# Patient Record
Sex: Female | Born: 1980
Health system: Southern US, Community
[De-identification: ages and names within clinical notes are randomized; demographics above are authoritative.]

## PROBLEM LIST (undated history)

## (undated) DIAGNOSIS — G43909 Migraine, unspecified, not intractable, without status migrainosus: Secondary | ICD-10-CM

## (undated) DIAGNOSIS — J45909 Unspecified asthma, uncomplicated: Secondary | ICD-10-CM

## (undated) HISTORY — DX: Unspecified asthma, uncomplicated: J45.909

## (undated) HISTORY — DX: Migraine, unspecified, not intractable, without status migrainosus: G43.909

---

## 2007-12-16 ENCOUNTER — Emergency Department (HOSPITAL_COMMUNITY): Admission: EM | Admit: 2007-12-16 | Discharge: 2007-12-16 | Payer: Self-pay | Admitting: Emergency Medicine

## 2011-02-10 LAB — URINALYSIS, ROUTINE W REFLEX MICROSCOPIC
Hgb urine dipstick: NEGATIVE
Ketones, ur: 40 — AB
Protein, ur: NEGATIVE
Specific Gravity, Urine: 1.029
Urobilinogen, UA: 1

## 2011-02-10 LAB — DIFFERENTIAL
Basophils Absolute: 0.1
Basophils Relative: 0
Eosinophils Absolute: 0.1
Eosinophils Relative: 0
Monocytes Absolute: 0.7

## 2011-02-10 LAB — POCT PREGNANCY, URINE: Preg Test, Ur: NEGATIVE

## 2011-02-10 LAB — COMPREHENSIVE METABOLIC PANEL
ALT: 14
AST: 24
Albumin: 4.5
Alkaline Phosphatase: 42
CO2: 26
Chloride: 102
GFR calc Af Amer: >60
Potassium: 3.9
Total Bilirubin: 1.5 — ABNORMAL HIGH

## 2011-02-10 LAB — CBC
Platelets: 335
RBC: 4.76
WBC: 15.6 — ABNORMAL HIGH

## 2011-02-10 LAB — URINE MICROSCOPIC-ADD ON

## 2011-06-02 ENCOUNTER — Ambulatory Visit (INDEPENDENT_AMBULATORY_CARE_PROVIDER_SITE_OTHER): Payer: 59

## 2011-06-02 DIAGNOSIS — H00019 Hordeolum externum unspecified eye, unspecified eyelid: Secondary | ICD-10-CM

## 2011-10-09 ENCOUNTER — Other Ambulatory Visit: Payer: Self-pay | Admitting: Physician Assistant

## 2011-12-05 ENCOUNTER — Ambulatory Visit (INDEPENDENT_AMBULATORY_CARE_PROVIDER_SITE_OTHER): Payer: 59 | Admitting: Gynecology

## 2011-12-05 ENCOUNTER — Encounter: Payer: Self-pay | Admitting: Gynecology

## 2011-12-05 VITALS — BP 120/84 | Ht 65.0 in | Wt 150.0 lb

## 2011-12-05 DIAGNOSIS — N949 Unspecified condition associated with female genital organs and menstrual cycle: Secondary | ICD-10-CM

## 2011-12-05 DIAGNOSIS — N938 Other specified abnormal uterine and vaginal bleeding: Secondary | ICD-10-CM | POA: Insufficient documentation

## 2011-12-05 LAB — CBC WITH DIFFERENTIAL/PLATELET
Basophils Absolute: 0 10*3/uL (ref 0.0–0.1)
Basophils Relative: 0 % (ref 0–1)
Hemoglobin: 11.9 g/dL — ABNORMAL LOW (ref 12.0–15.0)
MCHC: 31.2 g/dL (ref 30.0–36.0)
Monocytes Relative: 7 % (ref 3–12)
Neutro Abs: 4.4 10*3/uL (ref 1.7–7.7)
Neutrophils Relative %: 61 % (ref 43–77)
RDW: 14.8 % (ref 11.5–15.5)

## 2011-12-05 MED ORDER — MEGESTROL ACETATE 40 MG PO TABS: 40.0000 mg | ORAL_TABLET | Freq: Two times a day (BID) | ORAL | Status: AC

## 2011-12-05 NOTE — Progress Notes (Signed)
Patient 31 year old (same-sex relationship) new patient to the practice who stated that she has been bleeding for a week and a half which is quite earlier than her anticipated menstrual cycle. And she was recently sexually active with sex toys and felt that she may have had a vaginal tear contributing to her bleeding. She denies any weight changes, any medication, any visual disturbances, nipple discharge, or unusual headaches. Patient with no prior Pap smears.  Exam: Bartholin urethra Skene glands within normal limits Vagina: Blood present in the vaginal vault were no lesions or tears noted Cervix some blood extruding out of the cervical os Uterus: Anteverted normal size shape and consistency Adnexa: No palpable masses or tenderness on the left adnexa questionable fullness on the right adnexa Rectal: Not examined  Assessment/plan: Dysfunction uterine bleeding. Patient no prior intercourse with opposite sex member. Will check a TSH, prolactin, and CBC. She will be placed on Megace 40 mg twice a day for 5 days. She will return back to the office next week for sonohysterogram to complete evaluation. We'll need to rule out the possibility of a right ovarian cyst as well. She will need to return back in a later date perhaps in August for overdue Pap smear.

## 2011-12-05 NOTE — Addendum Note (Signed)
Addended byValeda Malm L on: 12/05/2011 02:13 PM   Modules accepted: Orders

## 2011-12-06 LAB — TSH: TSH: 2.92 u[IU]/mL (ref 0.350–4.500)

## 2011-12-07 LAB — PROLACTIN: Prolactin: 17.1 ng/mL

## 2011-12-21 ENCOUNTER — Telehealth: Payer: Self-pay | Admitting: *Deleted

## 2011-12-21 NOTE — Telephone Encounter (Signed)
Pt has [facility] appointment tomorrow, pt is now bleeding mild flow. Bleeding started on Tuesday, pt isnt sure if this is her cycle or just irregular bleeding. She would like to know if you still want her to come in for [facility]? Please advise

## 2011-12-21 NOTE — Telephone Encounter (Signed)
Pt informed with the below note. 

## 2011-12-21 NOTE — Telephone Encounter (Signed)
Tell patient to keep appointment for the sonohysterogram

## 2011-12-22 ENCOUNTER — Ambulatory Visit (INDEPENDENT_AMBULATORY_CARE_PROVIDER_SITE_OTHER): Payer: 59 | Admitting: Gynecology

## 2011-12-22 ENCOUNTER — Other Ambulatory Visit: Payer: Self-pay | Admitting: Gynecology

## 2011-12-22 ENCOUNTER — Ambulatory Visit (INDEPENDENT_AMBULATORY_CARE_PROVIDER_SITE_OTHER): Payer: 59

## 2011-12-22 DIAGNOSIS — D259 Leiomyoma of uterus, unspecified: Secondary | ICD-10-CM | POA: Insufficient documentation

## 2011-12-22 DIAGNOSIS — N938 Other specified abnormal uterine and vaginal bleeding: Secondary | ICD-10-CM

## 2011-12-22 DIAGNOSIS — N949 Unspecified condition associated with female genital organs and menstrual cycle: Secondary | ICD-10-CM

## 2011-12-22 DIAGNOSIS — N946 Dysmenorrhea, unspecified: Secondary | ICD-10-CM

## 2011-12-22 DIAGNOSIS — D649 Anemia, unspecified: Secondary | ICD-10-CM | POA: Insufficient documentation

## 2011-12-22 NOTE — Progress Notes (Signed)
Patient 31 year old (same-sex relationship) who was seen as a new patient to the practice on 12/05/2011 who stated that she had been bleeding for a week and a half which is quite earlier than her anticipated menstrual cycle. Patient is in a same-sex relationship was started on Megace 40 mg twice a day for 5 days to stop her bleeding and was instructed to return to the office today for sonohysterogram. She had a TSH and prolactin which were normal. Her CBC had a normal platelet count her hemoglobin was 11.9. She had been instructed to start taking one iron tablet daily.   Ultrasound 4/sonohysterogram: Uterus measures 7.6 x 5.0 x 3.4 cm with an endometrial stripe of 3 mm. 3 small fibroids were noted and the largest one measuring 10 x 6 mm. Both ovaries appeared to be normal. Sonohysterogram no intracavitary defect.  Assessment/plan: Isolated event of dysfunction uterine bleeding. Normal TSH and prolactin and sonohysterogram. We'll monitor her cycles over the next 3 months and she will maintain menstrual calendar. If she continues to have irregular bleeding we discussed different options  to include oral contraceptive pill versus Mirena IUD. Literature information was provided and we'll continue to monitor closely. All questions are answered and we'll follow accordingly.

## 2012-06-07 ENCOUNTER — Emergency Department (HOSPITAL_COMMUNITY): Payer: 59

## 2012-06-07 ENCOUNTER — Emergency Department (HOSPITAL_COMMUNITY)
Admission: EM | Admit: 2012-06-07 | Discharge: 2012-06-07 | Disposition: A | Discharge status: Home / Self Care | Payer: 59 | Attending: Emergency Medicine | Admitting: Emergency Medicine

## 2012-06-07 ENCOUNTER — Encounter (HOSPITAL_COMMUNITY): Payer: Self-pay | Admitting: Emergency Medicine

## 2012-06-07 ENCOUNTER — Encounter (HOSPITAL_COMMUNITY): Payer: Self-pay

## 2012-06-07 ENCOUNTER — Emergency Department (INDEPENDENT_AMBULATORY_CARE_PROVIDER_SITE_OTHER)
Admission: EM | Admit: 2012-06-07 | Discharge: 2012-06-07 | Disposition: A | Discharge status: Skilled Nursing Facility | Payer: 59 | Source: Home / Self Care

## 2012-06-07 DIAGNOSIS — R071 Chest pain on breathing: Secondary | ICD-10-CM | POA: Insufficient documentation

## 2012-06-07 DIAGNOSIS — R0789 Other chest pain: Secondary | ICD-10-CM

## 2012-06-07 DIAGNOSIS — R0989 Other specified symptoms and signs involving the circulatory and respiratory systems: Secondary | ICD-10-CM

## 2012-06-07 DIAGNOSIS — R079 Chest pain, unspecified: Secondary | ICD-10-CM

## 2012-06-07 DIAGNOSIS — R110 Nausea: Secondary | ICD-10-CM | POA: Insufficient documentation

## 2012-06-07 DIAGNOSIS — R0602 Shortness of breath: Secondary | ICD-10-CM | POA: Insufficient documentation

## 2012-06-07 DIAGNOSIS — R0600 Dyspnea, unspecified: Secondary | ICD-10-CM

## 2012-06-07 LAB — CBC WITH DIFFERENTIAL/PLATELET
Lymphocytes Relative: 26 % (ref 12–46)
Lymphs Abs: 2 10*3/uL (ref 0.7–4.0)
Neutro Abs: 4.9 10*3/uL (ref 1.7–7.7)
Neutrophils Relative %: 64 % (ref 43–77)
Platelets: 288 10*3/uL (ref 150–400)
RBC: 4.46 MIL/uL (ref 3.87–5.11)
WBC: 7.7 10*3/uL (ref 4.0–10.5)

## 2012-06-07 LAB — COMPREHENSIVE METABOLIC PANEL
ALT: 8 U/L (ref 0–35)
Alkaline Phosphatase: 43 U/L (ref 39–117)
CO2: 24 mEq/L (ref 19–32)
GFR calc Af Amer: >90 mL/min (ref >90)
GFR calc non Af Amer: 86 mL/min — ABNORMAL LOW (ref >90)
Glucose, Bld: 75 mg/dL (ref 70–99)
Potassium: 4 mEq/L (ref 3.5–5.1)
Sodium: 139 mEq/L (ref 135–145)

## 2012-06-07 MED ORDER — IOHEXOL 350 MG/ML SOLN
100.0000 mL | Freq: Once | INTRAVENOUS | Status: AC | PRN
Start: 2012-06-07 — End: 2012-06-07
  Administered 2012-06-07: 80 mL via INTRAVENOUS

## 2012-06-07 MED ORDER — HYDROCODONE-ACETAMINOPHEN 5-325 MG PO TABS: 1.0000 | ORAL_TABLET | Freq: Four times a day (QID) | ORAL | Status: DC | PRN

## 2012-06-07 MED ORDER — KETOROLAC TROMETHAMINE 30 MG/ML IJ SOLN
30.0000 mg | Freq: Once | INTRAMUSCULAR | Status: AC
  Administered 2012-06-07: 30 mg via INTRAVENOUS
  Filled 2012-06-07: qty 1

## 2012-06-07 MED ORDER — IBUPROFEN 800 MG PO TABS: 800.0000 mg | ORAL_TABLET | Freq: Three times a day (TID) | ORAL | Status: DC | PRN

## 2012-06-07 NOTE — ED Notes (Addendum)
C/o pain in left mid chest since last PM; pain waxes and wanes, but never completely relieved. Reports she had dizziness, nausea, but no sweats, vomiting; chest mildly tender to palpation, chest clear to ascultation; NAD at present; denies injury; has not taken any medication for her symptoms

## 2012-06-07 NOTE — ED Provider Notes (Signed)
History     CSN: [id number]  Arrival date & time 06/07/12  1412   First MD Initiated Contact with Patient 06/07/12 1426      No chief complaint on file.   (Consider location/radiation/quality/duration/timing/severity/associated sxs/prior treatment) HPI The patient presents to the emergency department with constant, left-sided chest pain for the last 24 hours.  The patient states that she does not have shortness of breath, nausea, vomiting, diarrhea, headache, back pain, neck pain, fever, cough, or syncope.  Patient, states that movement makes her pain, worse and some deep breathing, as well.  The patient states she did not take anything for her symptoms.  The patient denies any alleviating factors.     History reviewed. No pertinent past medical history.  History reviewed. No pertinent past surgical history.  Family History  Problem Relation Age of Onset  . Heart disease Sister     History  Substance Use Topics  . Smoking status: Never Smoker   . Smokeless tobacco: Never Used  . Alcohol Use: Yes     Comment: SOCIAL    OB History    Grav Para Term Preterm Abortions TAB SAB Ect Mult Living                  Review of Systems All other systems negative except as documented in the HPI. All pertinent positives and negatives as reviewed in the HPI. Allergies  Review of patient's allergies indicates no known allergies.  Home Medications  No current outpatient prescriptions on file.  BP 115/74  Pulse 89  Temp 99 F (37.2 C)  Resp 16  SpO2 100%  LMP 05/15/2012  Physical Exam  Nursing note and vitals reviewed. Constitutional: She is oriented to person, place, and time. She appears well-developed and well-nourished. No distress.  HENT:  Head: Normocephalic and atraumatic.  Eyes: EOM are normal. Pupils are equal, round, and reactive to light.  Cardiovascular: Normal rate, regular rhythm and normal heart sounds.  Exam reveals no gallop and no friction rub.   No murmur  heard. Pulmonary/Chest: Effort normal and breath sounds normal. She exhibits tenderness.  Neurological: She is alert and oriented to person, place, and time.  Skin: Skin is warm and dry. No rash noted.    ED Course  Procedures (including critical care time)  Labs Reviewed  COMPREHENSIVE METABOLIC PANEL - Abnormal; Notable for the following:    GFR calc non Af Amer 86 (*)     All other components within normal limits  D-DIMER, QUANTITATIVE - Abnormal; Notable for the following:    D-Dimer, Quant 1.03 (*)     All other components within normal limits  CBC WITH DIFFERENTIAL  POCT I-STAT TROPONIN I   Dg Chest 2 View  06/07/2012  *RADIOLOGY REPORT*  Clinical Data: Chest pain  CHEST - 2 VIEW  Comparison: None.  Findings: Cardiomediastinal silhouette is unremarkable.  No acute infiltrate or pleural effusion.  No pulmonary edema.  Bony thorax is unremarkable.  IMPRESSION:  No active disease.   Original Report Authenticated By: Natasha Mead, M.D.    Ct Angio Chest Pe W/cm &/or Wo Cm  06/07/2012  *RADIOLOGY REPORT*  Clinical Data: Pain, shortness of breath  CT ANGIOGRAPHY CHEST  Technique:  Multidetector CT imaging of the chest using the standard protocol during bolus administration of intravenous contrast. Multiplanar reconstructed images including MIPs were obtained and reviewed to evaluate the vascular anatomy.  Contrast: 80mL OMNIPAQUE IOHEXOL 350 MG/ML SOLN  Comparison: None.  Findings: Sagittal images of  the spine and sternum are unremarkable.  Images of the thoracic inlet are unremarkable.  Central airways are patent.  The study is of excellent technical quality.  No pulmonary embolus is noted.  There is no mediastinal hematoma or adenopathy.  The heart size is within normal limits.  No pericardial effusion. Visualized upper abdomen is unremarkable.  Images of the lung parenchyma shows no acute infiltrate or pleural effusion.  No pulmonary edema.  No pulmonary nodules are noted.  Nonspecific  bilateral axillary lymph nodes are noted.  IMPRESSION:  1.  No pulmonary embolus. 2.  No acute infiltrate or pulmonary edema. 3.  No adenopathy.   Original Report Authenticated By: Natasha Mead, M.D.    The patient is PERC negative.  The nursing staff ordered a d-dimer, which was high, and we confirmed there was no PE by CTA of his chest.  The patient is a young, healthy female, with no risk factors for coronary artery disease.  The patient will be treated for chest wall pain, based on her history of present illness, and physical exam findings, along with her testing here in the emergency department.   MDM          Carlyle Dolly, PA-C 06/07/12 1900

## 2012-06-07 NOTE — ED Notes (Signed)
Meal given to patient 

## 2012-06-07 NOTE — ED Notes (Signed)
Chest pain since last night felt nauseated  Whole left side some sob

## 2012-06-07 NOTE — ED Provider Notes (Signed)
Medical screening examination/treatment/procedure(s) were performed by resident physician or non-physician practitioner and as supervising physician I was immediately available for consultation/collaboration.   KINDL,JAMES DOUGLAS MD.    James D Kindl, MD 06/07/12 1417 

## 2012-06-07 NOTE — ED Notes (Signed)
Patient is resting comfortably. 

## 2012-06-07 NOTE — ED Provider Notes (Signed)
History     CSN: [id number]  Arrival date & time 06/07/12  1152   None     Chief Complaint  Patient presents with  . Chest Pain    (Consider location/radiation/quality/duration/timing/severity/associated sxs/prior treatment) HPI Comments: This is a 32 year old female considered herself in generally good health but is complaining of left anterior chest pain since last night. She describes the pain as a tightness and pressure. It is located beneath the left breast and substernal and often radiates to the  Left lateral and posterior chest.  It is constant but waxes and wanes. It is worse with exertion. It is associated with shortness of breath and is also worse with exertion. She had an episode of nausea and dizziness and this was self-limiting. It is not exacerbated with musculoskeletal movement or taking a deep breath. She denies any known chest wall tenderness. There is no personal medical history of chest or lung problems and her family medical history is negative for known cardiopulmonary disease.   History reviewed. No pertinent past medical history.  History reviewed. No pertinent past surgical history.  Family History  Problem Relation Age of Onset  . Heart disease Sister     History  Substance Use Topics  . Smoking status: Never Smoker   . Smokeless tobacco: Never Used  . Alcohol Use: Yes     Comment: SOCIAL    OB History    Grav Para Term Preterm Abortions TAB SAB Ect Mult Living                  Review of Systems  Constitutional: Positive for activity change. Negative for fever and diaphoresis.  HENT: Negative.   Respiratory: Positive for chest tightness and shortness of breath. Negative for cough.   Cardiovascular: Positive for chest pain. Negative for leg swelling.  Gastrointestinal: Positive for nausea. Negative for vomiting.  Genitourinary: Negative.   Musculoskeletal: Positive for back pain.  Skin: Negative.   Neurological: Positive for dizziness.    Hematological: Negative.   Psychiatric/Behavioral: The patient is nervous/anxious.     Allergies  Review of patient's allergies indicates no known allergies.  Home Medications  No current outpatient prescriptions on file.  BP 117/84  Pulse 86  Temp 98.8 F (37.1 C) (Oral)  Resp 10  SpO2 100%  Physical Exam  Nursing note and vitals reviewed. Constitutional: She is oriented to person, place, and time. She appears well-developed and well-nourished. No distress.  HENT:  Head: Normocephalic and atraumatic.  Mouth/Throat: No oropharyngeal exudate.  Eyes: EOM are normal. Pupils are equal, round, and reactive to light.  Neck: Normal range of motion. Neck supple. No JVD present.  Cardiovascular: Normal rate, normal heart sounds and intact distal pulses.   No murmur heard. Pulmonary/Chest: Effort normal and breath sounds normal. No respiratory distress. She has no wheezes. She has no rales. She exhibits no tenderness.       Unable to reproduce chest wall or costal margin tenderness with the exception of the upper most L para sternal area.  Abdominal: Soft. She exhibits no mass. There is no rebound and no guarding.       Minor tenderness in the epigastrium  Musculoskeletal: Normal range of motion. She exhibits no edema.  Neurological: She is alert and oriented to person, place, and time. No cranial nerve deficit.  Skin: Skin is warm and dry.  Psychiatric: She has a normal mood and affect.    ED Course  Procedures (including critical care time)  Labs Reviewed -  No data to display No results found.   1. Chest pain at rest   2. Dyspnea       MDM  32 year old female with atypical type chest pain. She describes the anterior L chest pain as tightness and pressure. It is constant but does worsen with exertion. It is associated with intermittent nausea and shortness of breath which is also worse with exertion. EKG is normal sinus rhythm with no ectopy or ST-T. wave changes. Although  she is at low risk a PE and angina consideration should be evaluated. She is stable and in no distress. She will be transferred by shuttle to the emergency department.           Hayden Rasmussen, NP 06/07/12 1408

## 2012-06-07 NOTE — ED Notes (Signed)
Patient states she has increased shortness of breath and chest pain with activity.

## 2012-06-07 NOTE — ED Notes (Signed)
comfortable

## 2012-06-07 NOTE — ED Provider Notes (Signed)
Medical screening examination/treatment/procedure(s) were performed by non-physician practitioner and as supervising physician I was immediately available for consultation/collaboration.   Lyanne Co, MD 06/07/12 1901

## 2013-01-01 ENCOUNTER — Emergency Department (HOSPITAL_COMMUNITY)
Admission: EM | Admit: 2013-01-01 | Discharge: 2013-01-01 | Disposition: A | Discharge status: Home / Self Care | Payer: 59 | Attending: Emergency Medicine | Admitting: Emergency Medicine

## 2013-01-01 ENCOUNTER — Encounter (HOSPITAL_COMMUNITY): Payer: Self-pay | Admitting: *Deleted

## 2013-01-01 ENCOUNTER — Emergency Department (HOSPITAL_COMMUNITY): Payer: 59

## 2013-01-01 DIAGNOSIS — S298XXA Other specified injuries of thorax, initial encounter: Secondary | ICD-10-CM | POA: Insufficient documentation

## 2013-01-01 DIAGNOSIS — Y9389 Activity, other specified: Secondary | ICD-10-CM | POA: Insufficient documentation

## 2013-01-01 DIAGNOSIS — S43402A Unspecified sprain of left shoulder joint, initial encounter: Secondary | ICD-10-CM

## 2013-01-01 DIAGNOSIS — IMO0002 Reserved for concepts with insufficient information to code with codable children: Secondary | ICD-10-CM | POA: Insufficient documentation

## 2013-01-01 DIAGNOSIS — Y92410 Unspecified street and highway as the place of occurrence of the external cause: Secondary | ICD-10-CM | POA: Insufficient documentation

## 2013-01-01 DIAGNOSIS — R0789 Other chest pain: Secondary | ICD-10-CM

## 2013-01-01 MED ORDER — DIAZEPAM 5 MG PO TABS: 5.0000 mg | ORAL_TABLET | Freq: Two times a day (BID) | ORAL | Status: DC
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
## 2013-01-01 NOTE — ED Provider Notes (Signed)
Medical screening examination/treatment/procedure(s) were performed by non-physician practitioner and as supervising physician I was immediately available for consultation/collaboration.  Matie Dimaano N Quintana Canelo, DO 01/01/13 2232 
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
## 2017-02-24 ENCOUNTER — Encounter: Payer: Self-pay | Admitting: Physician Assistant

## 2017-02-24 ENCOUNTER — Ambulatory Visit (INDEPENDENT_AMBULATORY_CARE_PROVIDER_SITE_OTHER): Payer: 59

## 2017-02-24 ENCOUNTER — Ambulatory Visit (INDEPENDENT_AMBULATORY_CARE_PROVIDER_SITE_OTHER): Payer: 59 | Admitting: Physician Assistant

## 2017-02-24 VITALS — BP 110/90 | HR 88 | Temp 98.3°F | Resp 16 | Ht 67.0 in | Wt 174.8 lb

## 2017-02-24 DIAGNOSIS — R059 Cough, unspecified: Secondary | ICD-10-CM

## 2017-02-24 DIAGNOSIS — R05 Cough: Secondary | ICD-10-CM | POA: Diagnosis not present

## 2017-02-24 DIAGNOSIS — J209 Acute bronchitis, unspecified: Secondary | ICD-10-CM | POA: Diagnosis not present

## 2017-02-24 MED ORDER — AZITHROMYCIN 250 MG PO TABS: ORAL_TABLET | ORAL | 0 refills | Status: DC

## 2017-02-24 MED ORDER — BENZONATATE 100 MG PO CAPS: 100.0000 mg | ORAL_CAPSULE | Freq: Three times a day (TID) | ORAL | 0 refills | Status: DC | PRN

## 2017-02-24 NOTE — Progress Notes (Signed)
Jessica Maldonado  MRN: [id number] DOB: [birth year]  PCP: Default, Provider, MD  Subjective:  Pt is a 36 year old female who presents to clinic for cough x 1 month. Cough is non productive. Worsened over the past 2 days. Is now keeping her up at night - improved when she sleeps more upright. She has not taken anything to feel better.  Shortness of breath x 5 months which has been made worse since cough. Shob with walking upstairs. Someone has told her sometimes she wheezes when she sleeps. Denies day time wheezing.   No PMH asthma.  No fam h/o asthma.  Non smoker. No seasonal allergies.  No allergies to medications.  No recent travel.  No medication changes.   Review of Systems  Constitutional: Negative for chills, fatigue and fever.  HENT: Negative for congestion, postnasal drip and rhinorrhea.   Respiratory: Positive for cough and shortness of breath. Negative for chest tightness and wheezing.   Cardiovascular: Negative for chest pain and palpitations.  Psychiatric/Behavioral: Positive for sleep disturbance.    Patient Active Problem List   Diagnosis Date Noted  . Anemia 12/22/2011  . Fibroid uterus 12/22/2011  . DUB (dysfunctional uterine bleeding) 12/05/2011    Current Outpatient Prescriptions on File Prior to Visit  Medication Sig Dispense Refill  . diazepam (VALIUM) 5 MG tablet Take 1 tablet (5 mg total) by mouth 2 (two) times daily. (Patient not taking: Reported on 02/24/2017) 5 tablet 0   No current facility-administered medications on file prior to visit.     No Known Allergies   Objective:  BP 110/90   Pulse 88   Temp 98.3 F (36.8 C) (Oral)   Resp 16   Ht 5\' 7"  (1.702 m)   Wt 174 lb 12.8 oz (79.3 kg)   LMP 02/08/2017   SpO2 100%   BMI 27.38 kg/m   Physical Exam  Constitutional: She is oriented to person, place, and time and well-developed, well-nourished, and in no distress. No distress.  Neck: Normal range of motion. Neck supple.    Cardiovascular: Normal rate, regular rhythm and normal heart sounds.   Pulmonary/Chest: Effort normal and breath sounds normal. No respiratory distress. She has no wheezes.  Neurological: She is alert and oriented to person, place, and time. GCS score is 15.  Skin: Skin is warm and dry.  Psychiatric: Mood, memory, affect and judgment normal.  Vitals reviewed.  Dg Chest 2 View  Result Date: 02/24/2017 CLINICAL DATA:  Cough. EXAM: CHEST  2 VIEW COMPARISON:  Radiographs of January 01, 2013. FINDINGS: The heart size and mediastinal contours are within normal limits. Both lungs are clear. No pneumothorax or pleural effusion is noted. The visualized skeletal structures are unremarkable. IMPRESSION: No active cardiopulmonary disease. Electronically Signed   By: Marijo Conception, M.D.   On: 02/24/2017 12:30    Assessment and Plan :  1. Cough 2. Acute bronchitis, unspecified organism - DG Chest 2 View; Future - benzonatate (TESSALON) 100 MG capsule; Take 1-2 capsules (100-200 mg total) by mouth 3 (three) times daily as needed for cough.  Dispense: 40 capsule; Refill: 0 - azithromycin (ZITHROMAX) 250 MG tablet; Take 2 tabs PO x 1 dose, then 1 tab PO QD x 4 days  Dispense: 6 tablet; Refill: 0 - Advised pt to stay well hydrated. Discussed possibility of lingering cough. F/u in 2-3 weeks for exertional shob evaluation. Suspect possible asthma. Plan for PFTs.   Mercer Pod, PA-C  [facility] 02/24/2017 12:01 PM

## 2017-02-24 NOTE — Patient Instructions (Addendum)
Come back and see me in 2-3 weeks.   Stay well hydrated.   For sore throat try using a honey-based tea. Use 3 teaspoons of honey with juice squeezed from half lemon. Place shaved pieces of ginger into 1/2-1 cup of water and warm over stove top. Then mix the ingredients and repeat every 4 hours as needed.  Cough Syrup Recipe: Sweet Lemon & Honey Thyme  Ingredients a handful of fresh thyme sprigs   1 pint of water (2 cups)  1/2 cup honey (raw is best, but regular will do)  1/2 lemon chopped Instructions 1. Place the lemon in the pint jar and cover with the honey. The honey will macerate the lemons and draw out liquids which taste so delicious! 2. Meanwhile, toss the thyme leaves into a saucepan and cover them with the water. 3. Bring the water to a gentle simmer and reduce it to half, about a cup of tea. 4. When the tea is reduced and cooled a bit, strain the sprigs & leaves, add it into the pint jar and stir it well. 5. Give it a shake and use a spoonful as needed. 6. Store your homemade cough syrup in the refrigerator for about a month.  What causes a cough? In adults, common causes of a cough include: ?An infection of the airways or lungs (such as the common cold) ?Postnasal drip - Postnasal drip is when mucus from the nose drips down or flows along the back of the throat. Postnasal drip can happen when people have: .A cold .Allergies .A sinus infection - The sinuses are hollow areas in the bones of the face that open into the nose. ?Lung conditions, like asthma and chronic obstructive pulmonary disease (COPD) - Both of these conditions can make it hard to breathe. COPD is usually caused by smoking. ?Acid reflux - Acid reflux is when the acid that is normally in your stomach backs up into your esophagus (the tube that carries food from your mouth to your stomach). ?A side effect from blood pressure medicines called "ACE inhibitors" ?Smoking cigarettes  Is there anything I can do on  my own to get rid of my cough? Yes. To help get rid of your cough, you can: ?Use a humidifier in your bedroom ?Use an over-the-counter cough medicine, or suck on cough drops or hard candy ?Stop smoking, if you smoke ?If you have allergies, avoid the things you are allergic to (like pollen, dust, animals, or mold) If you have acid reflux, your doctor or nurse will tell you which lifestyle changes can help reduce symptoms.     IF you received an x-ray today, you will receive an invoice from [facility] Rad[facility]. Please contact [facility] at [phone] with questions or concerns regarding your invoice.   IF you received labwork today, you will receive an invoice from [place]. Please contact [facility] at [phone] with questions or concerns regarding your invoice.   Our billing staff will not be able to assist you with questions regarding bills from these companies.  You will be contacted with the lab results as soon as they are available. The fastest way to get your results is to activate your My Chart account. Instructions are located on the last page of this paperwork. If you have not heard from [place] regarding the results in 2 weeks, please contact this office.

## 2017-02-26 ENCOUNTER — Ambulatory Visit: Payer: Self-pay | Admitting: Physician Assistant

## 2017-03-09 ENCOUNTER — Ambulatory Visit: Payer: 59 | Admitting: Physician Assistant

## 2017-03-10 ENCOUNTER — Ambulatory Visit: Payer: 59 | Admitting: Family Medicine

## 2017-03-13 ENCOUNTER — Ambulatory Visit (INDEPENDENT_AMBULATORY_CARE_PROVIDER_SITE_OTHER): Payer: 59 | Admitting: Physician Assistant

## 2017-03-13 VITALS — BP 120/90 | HR 78 | Temp 97.9°F | Resp 16 | Ht 66.0 in | Wt 179.6 lb

## 2017-03-13 DIAGNOSIS — R062 Wheezing: Secondary | ICD-10-CM

## 2017-03-13 MED ORDER — MOMETASONE FUROATE 220 MCG/INH IN AEPB: 2.0000 | INHALATION_SPRAY | Freq: Every day | RESPIRATORY_TRACT | 12 refills | Status: DC

## 2017-03-13 NOTE — Patient Instructions (Addendum)
Start your inhaler at night before bed: 2 puffs.   Come back and see me in 3-4 weeks.   Thank you for coming in today. I hope you feel we met your needs.  Feel free to call PCP if you have any questions or further requests.  Please consider signing up for MyChart if you do not already have it, as this is a great way to communicate with me.  Best,  Whitney McVey, PA-C  Mometasone inhalation powder What is this medicine? MOMETASONE (moe MET a sone) is a corticosteroid. It helps decrease inflammation in your lungs. This medicine is used to treat the symptoms of asthma. Never use this medicine for an acute asthma attack. This medicine may be used for other purposes; ask your health care provider or pharmacist if you have questions. COMMON BRAND NAME(S): Asmanex What should I tell my health care provider before I take this medicine? They need to know if you have any of these conditions: -bone problems -glaucoma -immune system problems -infection, like chickenpox, tuberculosis, herpes, or fungal infection -recent surgery or injury of the mouth or throat -taking corticosteroids by mouth -an unusual or allergic reaction to mometasone, steroids, other medicines, milk or milk proteins, foods, dyes, or preservatives -pregnant or trying to get pregnant -breast-feeding How should I use this medicine? This medicine is for inhalation through the mouth. Follow the directions on your prescription label. Make sure that you are using your inhaler correctly. Ask you doctor or health care provider if you have any questions. Rinse your mouth after each use. Take your medicine at regular intervals. Do not use it more often than directed. Do not stop taking except on your doctor's advice. Talk to your pediatrician regarding the use of this medicine in children. While this drug may be prescribed for children as young as 96 years of age for selected conditions, precautions do apply. Overdosage: If you think you  have taken too much of this medicine contact a poison control center or emergency room at once. NOTE: This medicine is only for you. Do not share this medicine with others. What if I miss a dose? If you miss a dose, use it as soon as you remember. If it is almost time for your next dose, use only that dose and continue with your regular schedule. Do not use double or extra doses. What may interact with this medicine? -ketoconazole This list may not describe all possible interactions. Give your health care provider a list of all the medicines, herbs, non-prescription drugs, or dietary supplements you use. Also tell them if you smoke, drink alcohol, or use illegal drugs. Some items may interact with your medicine. What should I watch for while using this medicine? Visit your doctor or health care professional for regular checks on your progress. Check with your health care professional if your symptoms do not improve. If your symptoms get worse or if you need your short acting inhalers more often, call your doctor right away. This medicine may increase your risk of getting an infection. Tell your doctor or health care professional if you are around anyone with measles or chickenpox, or if you develop sores or blisters that do not heal properly. Using this medicine for a long time may increase your risk of low bone mass. Talk to your doctor about bone health. What side effects may I notice from receiving this medicine? Side effects that you should report to your doctor or health care professional as soon as possible: -allergic  reactions like skin rash, itching or hives, swelling of the face, lips, or tongue -breathing problems -bone pain -changes in vision -feeling faint or lightheaded, falls -infection -nausea, vomiting -unusually weak or tired -white patches or sores in the mouth or throat Side effects that usually do not require medical attention (report to your doctor or health care  professional if they continue or are bothersome): -coughing, hoarseness or throat irritation -dry mouth -headache -loss of taste, or unpleasant taste -muscle pain -painful menstrual periods -stomach upset This list may not describe all possible side effects. Call your doctor for medical advice about side effects. You may report side effects to FDA at 1-800-FDA-1088. Where should I keep my medicine? Keep out of the reach of children Store in a dry place at room temperature between 15 and 30 degrees C (59 and 86 degrees F). Protect from light. Once the inhaler is removed from the foil package, it is good for 45 days. Throw away any unused medicine 45 days after opening the foil or after the expiration date, whichever comes first. The inhaler is empty when the dose counter reads 00. NOTE: This sheet is a summary. It may not cover all possible information. If you have questions about this medicine, talk to your doctor, pharmacist, or health care provider.  2018 Elsevier/Gold Standard (2012-10-17 11:38:36)   IF you received an x-ray today, you will receive an invoice from [facility] Radiology. Please contact [facility] Radiology at [phone] with questions or concerns regarding your invoice.   IF you received labwork today, you will receive an invoice from [place]. Please contact [facility] at [phone] with questions or concerns regarding your invoice.   Our billing staff will not be able to assist you with questions regarding bills from these companies.  You will be contacted with the lab results as soon as they are available. The fastest way to get your results is to activate your My Chart account. Instructions are located on the last page of this paperwork. If you have not heard from [place] regarding the results in 2 weeks, please contact this office.

## 2017-03-13 NOTE — Progress Notes (Signed)
   Jessica Maldonado  MRN: [id number] DOB: 1980/10/30  PCP: Default, Provider, MD  Subjective:  Pt is a 36 year old female who presents to clinic for f/u shortness of breath x five months. She was here 10/13 for cough x 1 month Rx Azithromycin - cough has resolved.  Endorses shob with exertion x 6 months. Her partner says she wheezes in her sleep sometimes. shob does not wake her up at night.  She has never been hospitalized for wheezing or shob.  No h/o asthma. No h/o seasonal allergies, however she endorses using eye drops for itchy eyes a few times a month.   Review of Systems  Respiratory: Positive for shortness of breath and wheezing. Negative for cough and chest tightness.   Cardiovascular: Negative for chest pain, palpitations and leg swelling.  Musculoskeletal: Negative for back pain and myalgias.  Skin: Negative.   Psychiatric/Behavioral: Negative for sleep disturbance.    Patient Active Problem List   Diagnosis Date Noted  . Anemia 12/22/2011  . Fibroid uterus 12/22/2011  . DUB (dysfunctional uterine bleeding) 12/05/2011    Current Outpatient Prescriptions on File Prior to Visit  Medication Sig Dispense Refill  . benzonatate (TESSALON) 100 MG capsule Take 1-2 capsules (100-200 mg total) by mouth 3 (three) times daily as needed for cough. 40 capsule 0  . diazepam (VALIUM) 5 MG tablet Take 1 tablet (5 mg total) by mouth 2 (two) times daily. (Patient not taking: Reported on 02/24/2017) 5 tablet 0   No current facility-administered medications on file prior to visit.     No Known Allergies   Objective:  BP 120/90   Pulse 78   Temp 97.9 F (36.6 C) (Oral)   Resp 16   Ht 5\' 6"  (1.676 m)   Wt 179 lb 9.6 oz (81.5 kg)   LMP 02/22/2017   SpO2 100%   BMI 28.99 kg/m   Physical Exam  Constitutional: She is oriented to person, place, and time and well-developed, well-nourished, and in no distress. No distress.  Cardiovascular: Normal rate, regular rhythm and normal  heart sounds.  Pulmonary/Chest: Effort normal and breath sounds normal. She has no wheezes. She has no rales.  Neurological: She is alert and oriented to person, place, and time. GCS score is 15.  Skin: Skin is warm and dry.  Psychiatric: Mood, memory, affect and judgment normal.  Vitals reviewed.   Assessment and Plan :  1. Wheezing - PFT PULM FXN SPIROMETRY (94010) - mometasone (ASMANEX) 220 MCG/INH inhaler; Inhale 2 puffs into the lungs at bedtime.  Dispense: 1 Inhaler; Refill: 12 - Suspect wheezing 2/2 asthma. Plan to start daily Asmanex. RTC in 3-4 weeks for recheck.   Mercer Pod, PA-C  [facility] 03/13/2017 8:54 AM

## 2017-03-14 ENCOUNTER — Encounter: Payer: Self-pay | Admitting: Physician Assistant

## 2017-03-19 ENCOUNTER — Telehealth: Payer: Self-pay

## 2017-03-19 NOTE — Telephone Encounter (Signed)
Jessica Maldonado, Patient's insurance rejected the request for [employer].  Could you please send an alternative inhaler that is less expensive (per patient request)? Thanks, [employer]

## 2017-03-20 ENCOUNTER — Telehealth: Payer: Self-pay | Admitting: Family Medicine

## 2017-03-20 NOTE — Telephone Encounter (Signed)
Pt calling back 11/6 again checking on an update on inhaler.

## 2017-03-20 NOTE — Telephone Encounter (Signed)
Left message that inhaler was called in on 03/13/2017 mometasone (ASMANEX) 220 MCG/INH inhaler 1 Inhaler 12 03/13/2017    Sig - Route: Inhale 2 puffs into the lungs at bedtime. - Inhalation   Sent to pharmacy as: mometasone (ASMANEX) 220 MCG/INH inhaler   E-Prescribing Status: Receipt confirmed by pharmacy (03/13/2017 10:01 AM EDT)    [id number] - [place],  - [place]  / Copied from [place] [phone]. Topic: Inquiry >> Mar 20, 2017  5:17 PM Neva Seat wrote: Pt has called numerus times to request an prescription for a Inhaler.  Pharmacy has sent several requests for inhaler as well.  Pt is upset the prescription hasn't been called in for her after trying to get it.

## 2017-03-21 NOTE — Telephone Encounter (Signed)
Pt called back today for status of inhaler refill

## 2017-03-26 ENCOUNTER — Telehealth: Payer: Self-pay

## 2017-03-26 MED ORDER — FLUTICASONE PROPIONATE HFA 220 MCG/ACT IN AERO: 2.0000 | INHALATION_SPRAY | Freq: Every evening | RESPIRATORY_TRACT | 12 refills | Status: DC | PRN

## 2017-03-26 NOTE — Telephone Encounter (Signed)
Spoke with Ankit at [facility]. State that patient insurance covers Flovent inhaler. Rx sent to pharmacy at this time, call placed to patient to make her aware./ S.Adamae Ricklefs,CMA

## 2017-03-26 NOTE — Telephone Encounter (Signed)
Patient called and said that the refill for the inhaler that was sent to the pharmacy and she says her insurance denied it and she needs. She said she needs this taken care of please.

## 2017-03-26 NOTE — Telephone Encounter (Signed)
Spoke with pharmacy they are not sure why it isn't covered.   Was on hold with insurance company 12 minutes, and unable to get through.   Called patient left detailed message to find out what they will cover and we can call that in. The pharmacy gave alternative but stated the patient needed to call due to some confusion.

## 2017-03-26 NOTE — Telephone Encounter (Signed)
**  If calling back tonight prior to 8pm please call work line @ [phone]

## 2017-04-11 ENCOUNTER — Ambulatory Visit: Payer: 59 | Admitting: Physician Assistant

## 2017-04-11 ENCOUNTER — Ambulatory Visit: Payer: 59 | Admitting: Family Medicine

## 2017-04-24 NOTE — Progress Notes (Deleted)
  No chief complaint on file.   HPI  4 review of systems  No past medical history on file.  Current Outpatient Medications  Medication Sig Dispense Refill  . benzonatate (TESSALON) 100 MG capsule Take 1-2 capsules (100-200 mg total) by mouth 3 (three) times daily as needed for cough. 40 capsule 0  . diazepam (VALIUM) 5 MG tablet Take 1 tablet (5 mg total) by mouth 2 (two) times daily. (Patient not taking: Reported on 02/24/2017) 5 tablet 0  . fluticasone (FLOVENT HFA) 220 MCG/ACT inhaler Inhale 2 puffs at bedtime as needed into the lungs. 1 Inhaler 12  . mometasone (ASMANEX) 220 MCG/INH inhaler Inhale 2 puffs into the lungs at bedtime. 1 Inhaler 12   No current facility-administered medications for this visit.     Allergies: No Known Allergies  No past surgical history on file.  Social History   Socioeconomic History  . Marital status: Single    Spouse name: Not on file  . Number of children: Not on file  . Years of education: Not on file  . Highest education level: Not on file  Social Needs  . Financial resource strain: Not on file  . Food insecurity - worry: Not on file  . Food insecurity - inability: Not on file  . Transportation needs - medical: Not on file  . Transportation needs - non-medical: Not on file  Occupational History  . Not on file  Tobacco Use  . Smoking status: Never Smoker  . Smokeless tobacco: Never Used  Substance and Sexual Activity  . Alcohol use: Yes    Comment: SOCIAL  . Drug use: No  . Sexual activity: Yes  Other Topics Concern  . Not on file  Social History Narrative  . Not on file    Family History  Problem Relation Age of Onset  . Heart disease Sister      ROS Review of Systems See HPI Constitution: No fevers or chills No malaise No diaphoresis Skin: No rash or itching Eyes: no blurry vision, no double vision GU: no dysuria or hematuria Neuro: no dizziness or headaches * all others reviewed and negative    Objective: There were no vitals filed for this visit.  Physical Exam  Assessment and Plan There are no diagnoses linked to this encounter.   Melvin Marmo P [employer]

## 2017-04-25 ENCOUNTER — Ambulatory Visit: Payer: 59 | Admitting: Family Medicine

## 2017-05-12 ENCOUNTER — Ambulatory Visit: Payer: 59 | Admitting: Family Medicine

## 2017-05-12 ENCOUNTER — Encounter: Payer: Self-pay | Admitting: Family Medicine

## 2017-05-12 VITALS — BP 126/88 | HR 74 | Temp 98.5°F | Resp 16 | Ht 66.0 in | Wt 190.0 lb

## 2017-05-12 DIAGNOSIS — J4520 Mild intermittent asthma, uncomplicated: Secondary | ICD-10-CM

## 2017-05-12 DIAGNOSIS — G43109 Migraine with aura, not intractable, without status migrainosus: Secondary | ICD-10-CM | POA: Diagnosis not present

## 2017-05-12 MED ORDER — SUMATRIPTAN SUCCINATE 25 MG PO TABS: 25.0000 mg | ORAL_TABLET | ORAL | 1 refills | Status: DC | PRN

## 2017-05-12 MED ORDER — FLUTICASONE PROPIONATE HFA 220 MCG/ACT IN AERO: 2.0000 | INHALATION_SPRAY | Freq: Two times a day (BID) | RESPIRATORY_TRACT | 12 refills | Status: DC

## 2017-05-12 MED ORDER — ALBUTEROL SULFATE HFA 108 (90 BASE) MCG/ACT IN AERS: 2.0000 | INHALATION_SPRAY | Freq: Four times a day (QID) | RESPIRATORY_TRACT | 0 refills | Status: DC | PRN

## 2017-05-12 NOTE — Addendum Note (Signed)
Addended by: Delia Chimes A on: 05/12/2017 11:23 AM   Modules accepted: Orders

## 2017-05-12 NOTE — Progress Notes (Addendum)
Chief Complaint  Patient presents with  . Follow-up    Inhaler usage, wheezing    HPI    She is using flovent She was prescribed flovent due to concern about asthma  She reports that the flovent has helped  She gets shortness of breath with activity and night time symptoms She reports that at night time she is also coughing and wheezing With activity she also feels very short of breath She reports that she lives with a smoker so she has second hand smoke exposure She reports that her triggers -  Smoking -  exercise -  Changing of the seasons  In the past 4 weeks she has had nighttime symptoms at least 2-3 times a week She has has interruption in her activity 4 days a week She has had chest tightness, shortness of breath and coughing  She reports that this started this year since April or May  She states that a couple of years ago she has been having difficulty   Menstrual migraines She has a monthly period that lasts 5-7 days She reports that she typically gets a migraine 2 weeks before her period starts She is sensitive to bright lights Her migraine is usually behind the eye on the right side She states that she takes excedrin HA She also reports that she also gets headaches with stress and tension.     No past medical history on file.  Current Outpatient Medications  Medication Sig Dispense Refill  . fluticasone (FLOVENT HFA) 220 MCG/ACT inhaler Inhale 2 puffs into the lungs 2 (two) times daily. 1 Inhaler 12  . albuterol (PROVENTIL HFA;VENTOLIN HFA) 108 (90 Base) MCG/ACT inhaler Inhale 2 puffs into the lungs every 6 (six) hours as needed for wheezing or shortness of breath. 1 Inhaler 0   No current facility-administered medications for this visit.     Allergies: No Known Allergies  No past surgical history on file.  Social History   Socioeconomic History  . Marital status: Single    Spouse name: None  . Number of children: None  . Years of education:  None  . Highest education level: None  Social Needs  . Financial resource strain: None  . Food insecurity - worry: None  . Food insecurity - inability: None  . Transportation needs - medical: None  . Transportation needs - non-medical: None  Occupational History  . None  Tobacco Use  . Smoking status: Never Smoker  . Smokeless tobacco: Never Used  Substance and Sexual Activity  . Alcohol use: Yes    Comment: SOCIAL  . Drug use: No  . Sexual activity: Yes  Other Topics Concern  . None  Social History Narrative  . None    Family History  Problem Relation Age of Onset  . Heart disease Sister      ROS Review of Systems See HPI Constitution: No fevers or chills No malaise No diaphoresis Skin: No rash or itching Eyes: no blurry vision, no double vision GU: no dysuria or hematuria Neuro: no dizziness or headaches all others reviewed and negative   Objective: Vitals:   05/12/17 1016  BP: 126/88  Pulse: 74  Resp: 16  Temp: 98.5 F (36.9 C)  TempSrc: Oral  SpO2: 100%  Weight: 190 lb (86.2 kg)  Height: 5\' 6"  (1.676 m)    Physical Exam General: alert, oriented, in NAD Head: normocephalic, atraumatic, no sinus tenderness Eyes: EOM intact, no scleral icterus or conjunctival injection Ears: TM clear bilaterally Nose: mucosa  nonerythematous, nonedematous Throat: no pharyngeal exudate or erythema Lymph: no posterior auricular, submental or cervical lymph adenopathy Heart: normal rate, normal sinus rhythm, no murmurs Lungs: clear to auscultation bilaterally, no wheezing   Assessment and Plan Jessica Maldonado was seen today for follow-up.  Diagnoses and all orders for this visit:  Reactive airway disease with wheezing, mild intermittent, uncomplicated -     Ambulatory referral to Allergy -     albuterol (PROVENTIL HFA;VENTOLIN HFA) 108 (90 Base) MCG/ACT inhaler; Inhale 2 puffs into the lungs every 6 (six) hours as needed for wheezing or shortness of breath. -      fluticasone (FLOVENT HFA) 220 MCG/ACT inhaler; Inhale 2 puffs into the lungs 2 (two) times daily.   Discussed reactive airway disease Likely she has asthma Will check for allergies Will refer for testing  Menstrual migraine Discussed either ocps to stop ovulation or continuing excedrin and pretreatment with ibuprofen Also discussed imitrex Pt to keep a journal  Also discussed warning signs of headaches that should lead to prompt evaluation   Jessica Maldonado A Nolon Rod

## 2017-05-12 NOTE — Patient Instructions (Addendum)
IF you received an x-ray today, you will receive an invoice from [facility]. Please contact [facility] at [phone] with questions or concerns regarding your invoice.   IF you received labwork today, you will receive an invoice from [place] [facility]. Please contact [facility] at [phone] with questions or concerns regarding your invoice.   Our billing staff will not be able to assist you with questions regarding bills from these companies.  You will be contacted with the lab results as soon as they are available. The fastest way to get your results is to activate your My Chart account. Instructions are located on the last page of this paperwork. If you have not heard from [place] regarding the results in 2 weeks, please contact this office.     How to Use a Metered Dose Inhaler A metered dose inhaler is a handheld device for taking medicine that must be breathed into the lungs (inhaled). The device can be used to deliver a variety of inhaled medicines, including:  Quick relief or rescue medicines, such as bronchodilators.  Controller medicines, such as corticosteroids.  The medicine is delivered by pushing down on a metal canister to release a preset amount of spray and medicine. Each device contains the amount of medicine that is needed for a preset number of uses (inhalations). Your health care provider may recommend that you use a spacer with your inhaler to help you take the medicine more effectively. A spacer is a plastic tube with a mouthpiece on one end and an opening that connects to the inhaler on the other end. A spacer holds the medicine in a tube for a short time, which allows you to inhale more medicine. What are the risks? If you do not use your inhaler correctly, medicine might not reach your lungs to help you breathe. Inhaler medicine can cause side effects, such as:  Mouth or throat infection.  Cough.  Hoarseness.  Headache.  Nausea and  vomiting.  Lung infection (pneumonia) in people who have a lung condition called COPD.  How to use a metered dose inhaler without a spacer 1. Remove the cap from the inhaler. 2. If you are using the inhaler for the first time, shake it for 5 seconds, turn it away from your face, then release 4 puffs into the air. This is called priming. 3. Shake the inhaler for 5 seconds. 4. Position the inhaler so the top of the canister faces up. 5. Put your index finger on the top of the medicine canister. Support the bottom of the inhaler with your thumb. 6. Breathe out normally and as completely as possible, away from the inhaler. 7. Either place the inhaler between your teeth and close your lips tightly around the mouthpiece, or hold the inhaler 1-2 inches (2.5-5 cm) away from your open mouth. Keep your tongue down out of the way. If you are unsure which technique to use, ask your health care provider. 8. Press the canister down with your index finger to release the medicine, then inhale deeply and slowly through your mouth (not your nose) until your lungs are completely filled. Inhaling should take 4-6 seconds. 9. Hold the medicine in your lungs for 5-10 seconds (10 seconds is best). This helps the medicine get into the small airways of your lungs. 10. With your lips in a tight circle (pursed), breathe out slowly. 11. Repeat steps 3-10 until you have taken the number of puffs that your health care provider directed. Wait about 1 minute  between puffs or as directed. 12. Put the cap on the inhaler. 13. If you are using a steroid inhaler, rinse your mouth with water, gargle, and spit out the water. Do not swallow the water. How to use a metered dose inhaler with a spacer 1. Remove the cap from the inhaler. 2. If you are using the inhaler for the first time, shake it for 5 seconds, turn it away from your face, then release 4 puffs into the air. This is called priming. 3. Shake the inhaler for 5  seconds. 4. Place the open end of the spacer onto the inhaler mouthpiece. 5. Position the inhaler so the top of the canister faces up and the spacer mouthpiece faces you. 6. Put your index finger on the top of the medicine canister. Support the bottom of the inhaler and the spacer with your thumb. 7. Breathe out normally and as completely as possible, away from the spacer. 8. Place the spacer between your teeth and close your lips tightly around it. Keep your tongue down out of the way. 9. Press the canister down with your index finger to release the medicine, then inhale deeply and slowly through your mouth (not your nose) until your lungs are completely filled. Inhaling should take 4-6 seconds. 10. Hold the medicine in your lungs for 5-10 seconds (10 seconds is best). This helps the medicine get into the small airways of your lungs. 11. With your lips in a tight circle (pursed), breathe out slowly. 12. Repeat steps 3-11 until you have taken the number of puffs that your health care provider directed. Wait about 1 minute between puffs or as directed. 13. Remove the spacer from the inhaler and put the cap on the inhaler. 14. If you are using a steroid inhaler, rinse your mouth with water, gargle, and spit out the water. Do not swallow the water. Follow these instructions at home:  Take your inhaled medicine only as told by your health care provider. Do not use the inhaler more than directed by your health care provider.  Keep all follow-up visits as told by your health care provider. This is important.  If your inhaler has a counter, you can check it to determine how full your inhaler is. If your inhaler does not have a counter, ask your health care provider when you will need to refill your inhaler and write the refill date on a calendar or on your inhaler canister. Note that you cannot know when an inhaler is empty by shaking it.  Follow directions on the package insert for care and cleaning of  your inhaler and spacer. Contact a health care provider if:  Symptoms are only partially relieved with your inhaler.  You are having trouble using your inhaler.  You have an increase in phlegm.  You have headaches. Get help right away if:  You feel little or no relief after using your inhaler.  You have dizziness.  You have a fast heart rate.  You have chills or a fever.  You have night sweats.  There is blood in your phlegm. Summary  A metered dose inhaler is a handheld device for taking medicine that must be breathed into the lungs (inhaled).  The medicine is delivered by pushing down on a metal canister to release a preset amount of spray and medicine.  Each device contains the amount of medicine that is needed for a preset number of uses (inhalations). This information is not intended to replace advice given to you  by your health care provider. Make sure you discuss any questions you have with your health care provider. Document Released: 05/01/2005 Document Revised: 03/21/2016 Document Reviewed: 03/21/2016 Elsevier Interactive Patient Education  2017 [employer].  Migraine Headache A migraine headache is an intense, throbbing pain on one side or both sides of the head. Migraines may also cause other symptoms, such as nausea, vomiting, and sensitivity to light and noise. What are the causes? Doing or taking certain things may also trigger migraines, such as:  Alcohol.  Smoking.  Medicines, such as: ? Medicine used to treat chest pain (nitroglycerine). ? Birth control pills. ? Estrogen pills. ? Certain blood pressure medicines.  Aged cheeses, chocolate, or caffeine.  Foods or drinks that contain nitrates, glutamate, aspartame, or tyramine.  Physical activity.  Other things that may trigger a migraine include:  Menstruation.  Pregnancy.  Hunger.  Stress, lack of sleep, too much sleep, or fatigue.  Weather changes.  What increases the risk? The  following factors may make you more likely to experience migraine headaches:  Age. Risk increases with age.  Family history of migraine headaches.  Being Caucasian.  Depression and anxiety.  Obesity.  Being a woman.  Having a hole in the heart (patent foramen ovale) or other heart problems.  What are the signs or symptoms? The main symptom of this condition is pulsating or throbbing pain. Pain may:  Happen in any area of the head, such as on one side or both sides.  Interfere with daily activities.  Get worse with physical activity.  Get worse with exposure to bright lights or loud noises.  Other symptoms may include:  Nausea.  Vomiting.  Dizziness.  General sensitivity to bright lights, loud noises, or smells.  Before you get a migraine, you may get warning signs that a migraine is developing (aura). An aura may include:  Seeing flashing lights or having blind spots.  Seeing bright spots, halos, or zigzag lines.  Having tunnel vision or blurred vision.  Having numbness or a tingling feeling.  Having trouble talking.  Having muscle weakness.  How is this diagnosed? A migraine headache can be diagnosed based on:  Your symptoms.  A physical exam.  Tests, such as CT scan or MRI of the head. These imaging tests can help rule out other causes of headaches.  Taking fluid from the spine (lumbar puncture) and analyzing it (cerebrospinal fluid analysis, or CSF analysis).  How is this treated? A migraine headache is usually treated with medicines that:  Relieve pain.  Relieve nausea.  Prevent migraines from coming back.  Treatment may also include:  Acupuncture.  Lifestyle changes like avoiding foods that trigger migraines.  Follow these instructions at home: Medicines  Take over-the-counter and prescription medicines only as told by your health care provider.  Do not drive or use heavy machinery while taking prescription pain medicine.  To  prevent or treat constipation while you are taking prescription pain medicine, your health care provider may recommend that you: ? Drink enough fluid to keep your urine clear or pale yellow. ? Take over-the-counter or prescription medicines. ? Eat foods that are high in fiber, such as fresh fruits and vegetables, whole grains, and beans. ? Limit foods that are high in fat and processed sugars, such as fried and sweet foods. Lifestyle  Avoid alcohol use.  Do not use any products that contain nicotine or tobacco, such as cigarettes and e-cigarettes. If you need help quitting, ask your health care provider.  Get  at least 8 hours of sleep every night.  Limit your stress. General instructions   Keep a journal to find out what may trigger your migraine headaches. For example, write down: ? What you eat and drink. ? How much sleep you get. ? Any change to your diet or medicines.  If you have a migraine: ? Avoid things that make your symptoms worse, such as bright lights. ? It may help to lie down in a dark, quiet room. ? Do not drive or use heavy machinery. ? Ask your health care provider what activities are safe for you while you are experiencing symptoms.  Keep all follow-up visits as told by your health care provider. This is important. Contact a health care provider if:  You develop symptoms that are different or more severe than your usual migraine symptoms. Get help right away if:  Your migraine becomes severe.  You have a fever.  You have a stiff neck.  You have vision loss.  Your muscles feel weak or like you cannot control them.  You start to lose your balance often.  You develop trouble walking.  You faint. This information is not intended to replace advice given to you by your health care provider. Make sure you discuss any questions you have with your health care provider. Document Released: 05/01/2005 Document Revised: 11/19/2015 Document Reviewed:  10/18/2015 Elsevier Interactive Patient Education  2017 [employer].

## 2017-05-27 ENCOUNTER — Encounter: Payer: Self-pay | Admitting: Family Medicine

## 2017-06-14 ENCOUNTER — Other Ambulatory Visit: Payer: Self-pay | Admitting: Family Medicine

## 2017-06-14 DIAGNOSIS — J4520 Mild intermittent asthma, uncomplicated: Secondary | ICD-10-CM

## 2017-06-29 ENCOUNTER — Ambulatory Visit: Payer: 59 | Admitting: Allergy

## 2017-07-25 ENCOUNTER — Ambulatory Visit: Payer: Self-pay | Admitting: Family Medicine

## 2017-08-11 ENCOUNTER — Other Ambulatory Visit: Payer: Self-pay

## 2017-08-11 ENCOUNTER — Encounter: Payer: Self-pay | Admitting: Family Medicine

## 2017-08-11 ENCOUNTER — Ambulatory Visit: Payer: BLUE CROSS/BLUE SHIELD | Admitting: Family Medicine

## 2017-08-11 VITALS — BP 122/78 | HR 80 | Temp 98.0°F | Resp 16 | Ht 66.34 in | Wt 185.6 lb

## 2017-08-11 DIAGNOSIS — J4520 Mild intermittent asthma, uncomplicated: Secondary | ICD-10-CM

## 2017-08-11 DIAGNOSIS — J301 Allergic rhinitis due to pollen: Secondary | ICD-10-CM

## 2017-08-11 DIAGNOSIS — R110 Nausea: Secondary | ICD-10-CM

## 2017-08-11 DIAGNOSIS — G43109 Migraine with aura, not intractable, without status migrainosus: Secondary | ICD-10-CM | POA: Diagnosis not present

## 2017-08-11 MED ORDER — MONTELUKAST SODIUM 10 MG PO TABS: 10.0000 mg | ORAL_TABLET | Freq: Every day | ORAL | 3 refills | Status: DC

## 2017-08-11 MED ORDER — SUMATRIPTAN SUCCINATE 25 MG PO TABS: 25.0000 mg | ORAL_TABLET | ORAL | 3 refills | Status: DC | PRN

## 2017-08-11 MED ORDER — CETIRIZINE HCL 10 MG PO TABS: 10.0000 mg | ORAL_TABLET | Freq: Every day | ORAL | 11 refills | Status: DC

## 2017-08-11 NOTE — Patient Instructions (Addendum)
Take sumatriptan at the first sign of headaches!     IF you received an x-ray today, you will receive an invoice from [facility]. Please contact [facility]iology at [phone] with questions or concerns regarding your invoice.   IF you received labwork today, you will receive an invoice from [place]. Please contact [facility] at [phone] with questions or concerns regarding your invoice.   Our billing staff will not be able to assist you with questions regarding bills from these companies.  You will be contacted with the lab results as soon as they are available. The fastest way to get your results is to activate your My Chart account. Instructions are located on the last page of this paperwork. If you have not heard from [place] regarding the results in 2 weeks, please contact this office.    Allergic Rhinitis, Adult Allergic rhinitis is an allergic reaction that affects the mucous membrane inside the nose. It causes sneezing, a runny or stuffy nose, and the feeling of mucus going down the back of the throat (postnasal drip). Allergic rhinitis can be mild to severe. There are two types of allergic rhinitis:  Seasonal. This type is also called hay fever. It happens only during certain seasons.  Perennial. This type can happen at any time of the year.  What are the causes? This condition happens when the body's defense system (immune system) responds to certain harmless substances called allergens as though they were germs.  Seasonal allergic rhinitis is triggered by pollen, which can come from grasses, trees, and weeds. Perennial allergic rhinitis may be caused by:  House dust mites.  Pet dander.  Mold spores.  What are the signs or symptoms? Symptoms of this condition include:  Sneezing.  Runny or stuffy nose (nasal congestion).  Postnasal drip.  Itchy nose.  Tearing of the eyes.  Trouble sleeping.  Daytime sleepiness.  How is this diagnosed? This  condition may be diagnosed based on:  Your medical history.  A physical exam.  Tests to check for related conditions, such as: ? Asthma. ? Pink eye. ? Ear infection. ? Upper respiratory infection.  Tests to find out which allergens trigger your symptoms. These may include skin or blood tests.  How is this treated? There is no cure for this condition, but treatment can help control symptoms. Treatment may include:  Taking medicines that block allergy symptoms, such as antihistamines. Medicine may be given as a shot, nasal spray, or pill.  Avoiding the allergen.  Desensitization. This treatment involves getting ongoing shots until your body becomes less sensitive to the allergen. This treatment may be done if other treatments do not help.  If taking medicine and avoiding the allergen does not work, new, stronger medicines may be prescribed.  Follow these instructions at home:  Find out what you are allergic to. Common allergens include smoke, dust, and pollen.  Avoid the things you are allergic to. These are some things you can do to help avoid allergens: ? Replace carpet with wood, tile, or vinyl flooring. Carpet can trap dander and dust. ? Do not smoke. Do not allow smoking in your home. ? Change your heating and air conditioning filter at least once a month. ? During allergy season:  Keep windows closed as much as possible.  Plan outdoor activities when pollen counts are lowest. This is usually during the evening hours.  When coming indoors, change clothing and shower before sitting on furniture or bedding.  Take over-the-counter and prescription medicines only as  told by your health care provider.  Keep all follow-up visits as told by your health care provider. This is important. Contact a health care provider if:  You have a fever.  You develop a persistent cough.  You make whistling sounds when you breathe (you wheeze).  Your symptoms interfere with your normal  daily activities. Get help right away if:  You have shortness of breath. Summary  This condition can be managed by taking medicines as directed and avoiding allergens.  Contact your health care provider if you develop a persistent cough or fever.  During allergy season, keep windows closed as much as possible. This information is not intended to replace advice given to you by your health care provider. Make sure you discuss any questions you have with your health care provider. Document Released: 01/24/2001 Document Revised: 06/08/2016 Document Reviewed: 06/08/2016 Elsevier Interactive Patient Education  [employer].

## 2017-08-11 NOTE — Progress Notes (Signed)
Chief Complaint  Patient presents with  . Asthma    pt states for the last feww weeks she has been having flair-ups with shob  . Migraine    pt states the Migrains has been unbarable with Nausea     HPI   Patient has not had a visit to allergy yet due to insurance  Nausea Pt reports that she has been having nausea throughout the day She states that she experiences it 3-4 times a week that is intermittent She states that she was naueous in the morning then again later in the day She states that she might get 3-4 episodes on a day with nausea that lasts 20-30 minutes She reports that it is not always associated with headaches She reports that she has not noticed any relationship with foods She states that if she eats or drinks it makes it worse  She reports that it seems to happen equally in the day and at night She reports that she has a lot of mucus and phlegm in her throat  Migraines Pt reports that she is getting migraines that are more often She states that in the past her migraines were around the time of her periods In the past 2 weeks she has had 4 episodes of headaches She takes excedrin headache first then if the headache becomes severe she takes the sumatriptan  Asthma She states that she is coughing more at night than in the day time She reports that she gets drainage and mucus at night She states that she gets some congestion She is using her inhaler rarely She states that in the past week she has used it 5-6 times She experiences shortness of breath, and coughing  She states that she does not hear wheezing but other people can hear it    Past Medical History:  Diagnosis Date  . Asthma   . Migraines     Current Outpatient Medications  Medication Sig Dispense Refill  . fluticasone (FLOVENT HFA) 220 MCG/ACT inhaler Inhale 2 puffs into the lungs 2 (two) times daily. 1 Inhaler 12  . PROAIR HFA 108 (90 Base) MCG/ACT inhaler INHALE 2 PUFFS INTO THE LUNGS EVERY  6 HOURS AS NEEDED FOR WHEEZING OR SHORTNESS OF BREATH 8.5 g 0  . SUMAtriptan (IMITREX) 25 MG tablet Take 1 tablet (25 mg total) by mouth every 2 (two) hours as needed for migraine. May repeat in 2 hours if headache persists or recurs. 30 tablet 3  . cetirizine (ZYRTEC) 10 MG tablet Take 1 tablet (10 mg total) by mouth daily. 30 tablet 11  . montelukast (SINGULAIR) 10 MG tablet Take 1 tablet (10 mg total) by mouth at bedtime. 30 tablet 3   No current facility-administered medications for this visit.     Allergies: No Known Allergies  History reviewed. No pertinent surgical history.  Social History   Socioeconomic History  . Marital status: Single    Spouse name: Not on file  . Number of children: Not on file  . Years of education: Not on file  . Highest education level: Not on file  Occupational History  . Not on file  Social Needs  . Financial resource strain: Not on file  . Food insecurity:    Worry: Not on file    Inability: Not on file  . Transportation needs:    Medical: Not on file    Non-medical: Not on file  Tobacco Use  . Smoking status: Never Smoker  . Smokeless tobacco: Never  Used  Substance and Sexual Activity  . Alcohol use: Yes    Comment: SOCIAL  . Drug use: No  . Sexual activity: Yes  Lifestyle  . Physical activity:    Days per week: Not on file    Minutes per session: Not on file  . Stress: Not on file  Relationships  . Social connections:    Talks on phone: Not on file    Gets together: Not on file    Attends religious service: Not on file    Active member of club or organization: Not on file    Attends meetings of clubs or organizations: Not on file    Relationship status: Not on file  Other Topics Concern  . Not on file  Social History Narrative  . Not on file    Family History  Problem Relation Age of Onset  . Heart disease Sister      ROS Review of Systems See HPI Constitution: No fevers or chills No malaise No  diaphoresis Skin: No rash or itching Eyes: no blurry vision, no double vision GU: no dysuria or hematuria Neuro: no dizziness or headaches  all others reviewed and negative   Objective: Vitals:   08/11/17 1053  BP: 122/78  Pulse: 80  Resp: 16  Temp: 98 F (36.7 C)  TempSrc: Oral  SpO2: 98%  Weight: 185 lb 9.6 oz (84.2 kg)  Height: 5' 6.34" (1.685 m)    Physical Exam General: alert, oriented, in NAD Head: normocephalic, atraumatic, no sinus tenderness Eyes: EOM intact, no scleral icterus or conjunctival injection Ears: TM clear bilaterally Nose: mucosa +erythematous, +edematous Throat: no pharyngeal exudate or erythema Lymph: no posterior auricular, submental or cervical lymph adenopathy Heart: normal rate, normal sinus rhythm, no murmurs Lungs: clear to auscultation bilaterally, no wheezing   Assessment and Plan Tangi-Love was seen today for asthma and migraine.  Diagnoses and all orders for this visit:  Nausea - advised pt that it seems as though her postnasal drip and mucus were contributing  Reactive airway disease with wheezing, mild intermittent, uncomplicated-  Advised allergy testing as pt needs to get an understanding about her triggers -     Ambulatory referral to Allergy  Seasonal allergic rhinitis due to pollen- advised singulair and zyrtec  Migraine with aura and without status migrainosus, not intractable - not taking imitrex appropriately Re-education provided  Other orders -     montelukast (SINGULAIR) 10 MG tablet; Take 1 tablet (10 mg total) by mouth at bedtime. -     cetirizine (ZYRTEC) 10 MG tablet; Take 1 tablet (10 mg total) by mouth daily. -     SUMAtriptan (IMITREX) 25 MG tablet; Take 1 tablet (25 mg total) by mouth every 2 (two) hours as needed for migraine. May repeat in 2 hours if headache persists or recurs.     [place]

## 2017-08-14 ENCOUNTER — Telehealth: Payer: Self-pay | Admitting: Family Medicine

## 2017-08-14 NOTE — Telephone Encounter (Signed)
Pt brought in blank FMLA forms to be completed by Dr Nolon Rod for her last OV. I was not sure how to complete the forms because I do not see that we took her out of work for anything. I will place the blank forms in Dr Nolon Rod box on 08/14/17 please return them to the FMLA/Disability desk within 5-7 business days. Thank you!

## 2017-08-15 NOTE — Telephone Encounter (Signed)
Done

## 2017-08-16 ENCOUNTER — Telehealth: Payer: Self-pay | Admitting: Family Medicine

## 2017-08-16 NOTE — Telephone Encounter (Signed)
Copied from [place]. Topic: General - Other >> Aug 16, 2017 10:36 AM Cecelia Byars, [place] wrote: Reason for CRM: Patient called to check on the status of FMLA paperwork ,please call her at  [phone] thanks

## 2017-08-24 ENCOUNTER — Telehealth: Payer: Self-pay | Admitting: Family Medicine

## 2017-08-24 NOTE — Telephone Encounter (Signed)
Copied from [place]. Topic: Quick Communication - See Telephone Encounter >> Aug 24, 2017  4:21 PM Cleaster Corin, [place] wrote: CRM for notification. See Telephone encounter for: 08/24/17.  Pt. Calling to see if additionally FMLA paperwork has been sent over more details needed for Dr. Nolon Rod.

## 2017-08-27 NOTE — Telephone Encounter (Signed)
Paperwork was updated and faxed back to [employer] on 4/15

## 2017-09-06 DIAGNOSIS — Z0271 Encounter for disability determination: Secondary | ICD-10-CM

## 2017-09-06 NOTE — Telephone Encounter (Signed)
Patient stated that 8hrs a month that she was approved for FMLA for the month is not enough.  She has already missed way more than that.  She will need that time increased because the migranes are so bad she can't be on the floor with the lights on.  She experiences migranes at least half of the week.  Her supervisor has sent her home several times and the supervisor would like to get this documented on paperwork, like the FMLA.  Also, the way the FMLA is set up, she has to miss the entire day, but sometimes she just feels bad two hours at the beginning of the day and then she goes to work.  She does not want to loose her job over this. She also realizes she has more problems with her Asthma while she is at work.  Please refill out the [facility] paperwork with hours increase and not having the hours being taken straight. The patient would also like a call back from the provider or the nurse concerning this M-F after 6pm. At this point it's urgent.

## 2017-09-07 NOTE — Telephone Encounter (Signed)
Will send pt a mychart message about making an appt for a follow up.

## 2017-09-07 NOTE — Telephone Encounter (Signed)
Please schedule

## 2017-09-07 NOTE — Telephone Encounter (Signed)
Please advise 

## 2017-09-07 NOTE — Telephone Encounter (Signed)
She needs an office visit 

## 2017-09-25 NOTE — Progress Notes (Signed)
Chief Complaint  Patient presents with  . Asthma    past couple days and pt has been sick so she thinks this was a trigger  . migraines    not having one right now, last one last week, onset: on and off x 2 days, sumatriptan taken and excedrin migraine  . 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 and cannot take a respiratory break to relax She reports that is she feels short of breath she can use her proair but it counts against her that she stops to use her inhaler She states that she typically uses her inhaler while on her break time but occasionally gets short of breath on a call Stopping a call gets her written up so she often waits  ACT score 13 With night time symptoms, using proair 2-3 times per day and 3 times a week feeling like it affects her activities   Migraines Patient reports that her headaches are about the same She is taking sumatriptan and excedrin migraine for her headaches She states that she gets headaches 1-2 times a week She is taking the sumatriptan at the first sign of headaches which helps Her headaches durations are usually 4 hours or so    Past Medical History:  Diagnosis Date  . Asthma   . Migraines     Current Outpatient Medications  Medication Sig Dispense Refill  . cetirizine (ZYRTEC) 10 MG tablet Take 1 tablet (10 mg total) by mouth daily. 30 tablet 11  . montelukast (SINGULAIR) 10 MG tablet Take 1 tablet (10 mg total) by mouth at bedtime. 30 tablet 3  . PROAIR HFA 108 (90 Base) MCG/ACT inhaler INHALE 2 PUFFS INTO THE LUNGS EVERY 6 HOURS AS NEEDED FOR WHEEZING OR SHORTNESS OF BREATH 8.5 g 0  . SUMAtriptan (IMITREX) 25 MG tablet Take 1 tablet (25 mg total) by mouth every 2 (two) hours as needed for migraine. May repeat in 2 hours if headache  persists or recurs. 30 tablet 3  . Fluticasone-Salmeterol (ADVAIR) 250-50 MCG/DOSE AEPB Inhale 1 puff into the lungs 2 (two) times daily. 60 each 3   No current facility-administered medications for this visit.     Allergies: No Known Allergies  No past surgical history on file.  Social History   Socioeconomic History  . Marital status: Single    Spouse name: Not on file  . Number of children: Not on file  . Years of education: Not on file  . Highest education level: Not on file  Occupational History  . Not on file  Social Needs  . Financial resource strain: Not on file  . Food insecurity:    Worry: Not on file    Inability: Not on file  . Transportation needs:    Medical: Not on file    Non-medical: Not on file  Tobacco Use  . Smoking status: Never Smoker  . Smokeless tobacco: Never Used  Substance and Sexual Activity  . Alcohol use: Yes    Comment: SOCIAL  . Drug use: No  . Sexual activity: Yes  Lifestyle  . Physical activity:    Days per week: Not on file    Minutes per session: Not on file  . Stress: Not on file  Relationships  . Social connections:    Talks on phone: Not on file    Gets together: Not on file    Attends religious service: Not on file    Active member of club or organization: Not on file    Attends meetings of clubs or organizations: Not on file    Relationship status: Not on file  Other Topics Concern  . Not on file  Social History Narrative  . Not on file    Family History  Problem Relation Age of Onset  . Heart disease Sister      ROS Review of Systems See HPI Constitution: No fevers or chills No malaise No diaphoresis Skin: No rash or itching Eyes: no blurry vision, no double vision GU: no dysuria or hematuria Neuro: no dizziness or headaches all others reviewed and negative   Objective: Vitals:   09/27/17 0911  BP: 106/70  Pulse: (!) 114  Resp: 16  Temp: 98.9 F (37.2 C)  TempSrc: Oral  SpO2: 100%  Weight: 183  lb (83 kg)  Height: 5' 6.34" (1.685 m)    Physical Exam  General: alert, oriented, in NAD Head: normocephalic, atraumatic, no sinus tenderness Eyes: EOM intact, no scleral icterus or conjunctival injection Ears: TM clear bilaterally Nose: mucosa nonerythematous, nonedematous Throat: no pharyngeal exudate or erythema Lymph: no posterior auricular, submental or cervical lymph adenopathy Heart: normal rate, normal sinus rhythm, no murmurs Lungs: clear to auscultation bilaterally, no wheezing   Assessment and Plan Tangi-Love was seen today for asthma, migraines and fmla.  Diagnoses and all orders for this visit:  Problem List Items Addressed This Visit      Cardiovascular and Mediastinum   Migraine with aura and without status migrainosus, not intractable    Migraine is improved with using imitrex first Will complete FMLA indicating pt may need a few hours off for episodes but would recommend 4 days a month for episdoes        Respiratory   Moderate persistent asthma without complication - Primary    Asthma Control Test given Score is 14/25 Patient asthma consistent with mild/intermittent, moderate/persistent, severe uncontrolled -  Referral faxed to Dr. Donneta Romberg for allergy and pulmonology in April. Pt given number to call today.  She has been instructed that she should be using her rescue inhaler as needed and not wait until her scheduled breaks She should take 2 puffs then wait 10-15 minutes before resuming her activities The importance of resting after albuterol is important to monitor symptoms to see if the dose was effective and to allow the heart rate to normalize Albuterol has a side effect of transient tachycardia      Relevant Medications   Fluticasone-Salmeterol (ADVAIR) 250-50 MCG/DOSE AEPB      A total of 25 minutes were spent face-to-face with the patient during this encounter and over half of that time was spent on counseling and coordination of  care.   [place]

## 2017-09-27 ENCOUNTER — Ambulatory Visit: Payer: BLUE CROSS/BLUE SHIELD | Admitting: Family Medicine

## 2017-09-27 ENCOUNTER — Encounter: Payer: Self-pay | Admitting: Family Medicine

## 2017-09-27 ENCOUNTER — Other Ambulatory Visit: Payer: Self-pay

## 2017-09-27 VITALS — BP 106/70 | HR 114 | Temp 98.9°F | Resp 16 | Ht 66.34 in | Wt 183.0 lb

## 2017-09-27 DIAGNOSIS — J4540 Moderate persistent asthma, uncomplicated: Secondary | ICD-10-CM

## 2017-09-27 DIAGNOSIS — G43109 Migraine with aura, not intractable, without status migrainosus: Secondary | ICD-10-CM | POA: Diagnosis not present

## 2017-09-27 MED ORDER — FLUTICASONE-SALMETEROL 250-50 MCG/DOSE IN AEPB: 1.0000 | INHALATION_SPRAY | Freq: Two times a day (BID) | RESPIRATORY_TRACT | 3 refills | Status: DC

## 2017-09-27 NOTE — Assessment & Plan Note (Signed)
Migraine is improved with using imitrex first Will complete FMLA indicating pt may need a few hours off for episodes but would recommend 4 days a month for episdoes

## 2017-09-27 NOTE — Assessment & Plan Note (Addendum)
Asthma Control Test given Score is 14/25 Patient asthma consistent with mild/intermittent, moderate/persistent, severe uncontrolled -  Referral faxed to Dr. Donneta Romberg for allergy and pulmonology in April. Pt given number to call today.  She has been instructed that she should be using her rescue inhaler as needed and not wait until her scheduled breaks She should take 2 puffs then wait 10-15 minutes before resuming her activities The importance of resting after albuterol is important to monitor symptoms to see if the dose was effective and to allow the heart rate to normalize Albuterol has a side effect of transient tachycardia

## 2017-09-27 NOTE — Patient Instructions (Signed)
     IF you received an x-ray today, you will receive an invoice from [facility]. Please contact [facility] at [phone] with questions or concerns regarding your invoice.   IF you received labwork today, you will receive an invoice from [facility]. Please contact [facility] at [phone] with questions or concerns regarding your invoice.   Our billing staff will not be able to assist you with questions regarding bills from these companies.  You will be contacted with the lab results as soon as they are available. The fastest way to get your results is to activate your My Chart account. Instructions are located on the last page of this paperwork. If you have not heard from us regarding the results in 2 weeks, please contact this office.     

## 2017-09-28 ENCOUNTER — Encounter: Payer: Self-pay | Admitting: Family Medicine

## 2017-10-03 ENCOUNTER — Encounter: Payer: Self-pay | Admitting: Family Medicine

## 2017-10-04 ENCOUNTER — Other Ambulatory Visit: Payer: Self-pay | Admitting: Family Medicine

## 2017-10-04 DIAGNOSIS — J4520 Mild intermittent asthma, uncomplicated: Secondary | ICD-10-CM

## 2017-10-04 DIAGNOSIS — Z0271 Encounter for disability determination: Secondary | ICD-10-CM

## 2017-10-06 ENCOUNTER — Other Ambulatory Visit: Payer: Self-pay | Admitting: Family Medicine

## 2017-10-06 ENCOUNTER — Ambulatory Visit: Payer: BLUE CROSS/BLUE SHIELD | Admitting: Family Medicine

## 2017-10-09 NOTE — Telephone Encounter (Signed)
Singulair refill (Patient requesting 90 day supply) Last OV:08/11/17 Last refill:08/11/17 30 tab/3 refill [id number] Pharmacy: [facility] [place], [address] AT [facility] [address] [phone] (Phone) [phone] (Fax)

## 2017-10-17 ENCOUNTER — Other Ambulatory Visit: Payer: Self-pay | Admitting: Family Medicine

## 2017-10-23 ENCOUNTER — Telehealth: Payer: Self-pay | Admitting: *Deleted

## 2017-10-23 NOTE — Telephone Encounter (Signed)
Called patient to inform that her prescription for singular was called in on 10/10/2017 to [facility] in [place]

## 2017-10-25 ENCOUNTER — Telehealth: Payer: Self-pay | Admitting: Family Medicine

## 2017-10-25 MED ORDER — MONTELUKAST SODIUM 10 MG PO TABS: ORAL_TABLET | ORAL | 0 refills | Status: DC

## 2017-10-25 NOTE — Telephone Encounter (Signed)
Copied from [place] [id number]. Topic: General - Other >> Oct 19, 2017  8:23 AM Yvette Rack wrote: Reason for CRM: patient state that the request for montelukast (SINGULAIR) 10 MG tablet  Notes to Pharmacy: **Patient requests 90 days supply**   Was never sent to the pharmacy and its been over a week please send to the [place], [place] [address] AT [place]    >> Oct 22, 2017 12:30 PM Yvette Rack wrote: Pt calling back again about RX Singulair still not at pharmacy  >> Oct 22, 2017  5:22 PM Neva Seat wrote: Pt called back checking on her refill.  Pt was told about the turnaround time and that a 3rd note is being sent to express her needing the refill. >> Oct 25, 2017  4:26 PM Boyd Kerbs wrote: Pt. Called saying [facility] for Singular   Has to be recalled in to [facility] - there was a hiccup and needs to have done differently.   Please call Pharmacist today.   She is out and been trying to get this taken care of for days.

## 2017-10-25 NOTE — Telephone Encounter (Signed)
Spoke with Sam at [facility] regarding refill of Singulair. Sam states that due to the pt's insurance a new prescription for a 90 day supply needed to be sent in to get insurance to cover the medication. Medication sent to [facility] in [place] as requested by pt.

## 2017-11-01 ENCOUNTER — Telehealth: Payer: Self-pay | Admitting: Family Medicine

## 2017-11-01 NOTE — Telephone Encounter (Signed)
I left a voicemail in regards to Ms. Jessica Maldonado appt she has with Dr. Nolon Rod on 11/29/2017. The provider is leaving at 1pm on that day and pt would need to be rescheduled.

## 2017-11-29 ENCOUNTER — Ambulatory Visit: Payer: BLUE CROSS/BLUE SHIELD | Admitting: Family Medicine

## 2017-12-12 ENCOUNTER — Encounter: Payer: Self-pay | Admitting: Family Medicine

## 2017-12-13 ENCOUNTER — Other Ambulatory Visit: Payer: Self-pay

## 2017-12-13 DIAGNOSIS — J4520 Mild intermittent asthma, uncomplicated: Secondary | ICD-10-CM

## 2017-12-13 MED ORDER — FLUTICASONE-SALMETEROL 250-50 MCG/DOSE IN AEPB: 1.0000 | INHALATION_SPRAY | Freq: Two times a day (BID) | RESPIRATORY_TRACT | 3 refills | Status: DC

## 2017-12-13 MED ORDER — SUMATRIPTAN SUCCINATE 25 MG PO TABS: 25.0000 mg | ORAL_TABLET | ORAL | 3 refills | Status: DC | PRN

## 2017-12-13 MED ORDER — ALBUTEROL SULFATE HFA 108 (90 BASE) MCG/ACT IN AERS: INHALATION_SPRAY | RESPIRATORY_TRACT | 3 refills | Status: DC

## 2017-12-13 MED ORDER — MONTELUKAST SODIUM 10 MG PO TABS: ORAL_TABLET | ORAL | 3 refills | Status: DC

## 2017-12-13 MED ORDER — CETIRIZINE HCL 10 MG PO TABS: 10.0000 mg | ORAL_TABLET | Freq: Every day | ORAL | 11 refills | Status: DC

## 2018-02-25 NOTE — Telephone Encounter (Signed)
 No response from pt. Since she cancelled all appts in September. Case will be closed. If pt. Calls us  in future, can be reconsidered.

## 2018-03-05 ENCOUNTER — Telehealth: Payer: Self-pay | Admitting: *Deleted

## 2018-03-05 ENCOUNTER — Telehealth: Payer: Self-pay | Admitting: Family Medicine

## 2018-03-05 NOTE — Telephone Encounter (Signed)
Copied from [place] [id number]. Topic: General - Other >> Feb 28, 2018  1:42 PM Keene Breath wrote: Reason for CRM: Patient called to inform the doctor that she will be faxing over a Health Wellness Appeal form to be signed by the doctor.  Once it is signed, patient would like a call back to see how she can pick up the form.  Please advise.  CB# [phone]. >> Feb 28, 2018  3:50 PM Ronnie Doss wrote: I called pt(after finding the fax) and she gave me the # of where to fax her Health Screening form to.  >> Mar 05, 2018  2:02 PM [place], [place] A wrote: Pt called in and checking on the status of this form.  She stated that she has not rec'd it   Has been faxed to pt  She did not have fax number on hand to reconfirm   Best number [phone]

## 2018-03-05 NOTE — Telephone Encounter (Signed)
Spoke to patient, I let her know the Health Wellness Appeal form was faxed 02/28/2018 by Barnett Applebaum in medical records. I advised her to check with her employer to see if it was received. Also, it was sent to be scanned in the her chart.

## 2018-03-07 ENCOUNTER — Telehealth: Payer: Self-pay

## 2018-03-07 NOTE — Telephone Encounter (Signed)
Copied from [place] [id number]. Topic: General - Other >> Mar 07, 2018 10:54 AM Carolyn Stare wrote:  Pt is asking if her Health form that was completed is available for her to pick up in the office  today .Would like a call back    [phone]  Message sent to [facility] to advise.

## 2018-03-07 NOTE — Telephone Encounter (Signed)
I do not deal with health forms--according to the notes in patients chart the forms were faxed already by Medical records--I will send message to them to see if they have a copy for the patient to come pick up

## 2018-04-05 ENCOUNTER — Other Ambulatory Visit: Payer: Self-pay

## 2018-04-05 ENCOUNTER — Encounter: Payer: Self-pay | Admitting: Family Medicine

## 2018-04-05 ENCOUNTER — Ambulatory Visit: Payer: BLUE CROSS/BLUE SHIELD | Admitting: Family Medicine

## 2018-04-05 VITALS — BP 126/82 | HR 96 | Temp 97.7°F | Resp 16 | Ht 66.34 in | Wt 185.8 lb

## 2018-04-05 DIAGNOSIS — J4540 Moderate persistent asthma, uncomplicated: Secondary | ICD-10-CM | POA: Diagnosis not present

## 2018-04-05 DIAGNOSIS — G43109 Migraine with aura, not intractable, without status migrainosus: Secondary | ICD-10-CM | POA: Diagnosis not present

## 2018-04-05 MED ORDER — PROPRANOLOL HCL ER 60 MG PO CP24: 60.0000 mg | ORAL_CAPSULE | Freq: Every day | ORAL | 0 refills | Status: DC

## 2018-04-05 NOTE — Patient Instructions (Addendum)
If you have lab work done today you will be contacted with your lab results within the next 2 weeks.  If you have not heard from [place] then please contact us. The fastest way to get your results is to register for My Chart.   IF you received an x-ray today, you will receive an invoice from [facility]. Please contact [facility] at [phone] with questions or concerns regarding your invoice.   IF you received labwork today, you will receive an invoice from [place]. Please contact [facility] at [phone] with questions or concerns regarding your invoice.   Our billing staff will not be able to assist you with questions regarding bills from these companies.  You will be contacted with the lab results as soon as they are available. The fastest way to get your results is to activate your My Chart account. Instructions are located on the last page of this paperwork. If you have not heard from [place] regarding the results in 2 weeks, please contact this office.     Migraine Headache A migraine headache is an intense, throbbing pain on one side or both sides of the head. Migraines may also cause other symptoms, such as nausea, vomiting, and sensitivity to light and noise. What are the causes? Doing or taking certain things may also trigger migraines, such as:  Alcohol.  Smoking.  Medicines, such as: ? Medicine used to treat chest pain (nitroglycerine). ? Birth control pills. ? Estrogen pills. ? Certain blood pressure medicines.  Aged cheeses, chocolate, or caffeine.  Foods or drinks that contain nitrates, glutamate, aspartame, or tyramine.  Physical activity.  Other things that may trigger a migraine include:  Menstruation.  Pregnancy.  Hunger.  Stress, lack of sleep, too much sleep, or fatigue.  Weather changes.  What increases the risk? The following factors may make you more likely to experience migraine headaches:  Age. Risk increases with  age.  Family history of migraine headaches.  Being Caucasian.  Depression and anxiety.  Obesity.  Being a woman.  Having a hole in the heart (patent foramen ovale) or other heart problems.  What are the signs or symptoms? The main symptom of this condition is pulsating or throbbing pain. Pain may:  Happen in any area of the head, such as on one side or both sides.  Interfere with daily activities.  Get worse with physical activity.  Get worse with exposure to bright lights or loud noises.  Other symptoms may include:  Nausea.  Vomiting.  Dizziness.  General sensitivity to bright lights, loud noises, or smells.  Before you get a migraine, you may get warning signs that a migraine is developing (aura). An aura may include:  Seeing flashing lights or having blind spots.  Seeing bright spots, halos, or zigzag lines.  Having tunnel vision or blurred vision.  Having numbness or a tingling feeling.  Having trouble talking.  Having muscle weakness.  How is this diagnosed? A migraine headache can be diagnosed based on:  Your symptoms.  A physical exam.  Tests, such as CT scan or MRI of the head. These imaging tests can help rule out other causes of headaches.  Taking fluid from the spine (lumbar puncture) and analyzing it (cerebrospinal fluid analysis, or CSF analysis).  How is this treated? A migraine headache is usually treated with medicines that:  Relieve pain.  Relieve nausea.  Prevent migraines from coming back.  Treatment may also include:  Acupuncture.  Lifestyle changes like avoiding  foods that trigger migraines.  Follow these instructions at home: Medicines  Take over-the-counter and prescription medicines only as told by your health care provider.  Do not drive or use heavy machinery while taking prescription pain medicine.  To prevent or treat constipation while you are taking prescription pain medicine, your health care provider  may recommend that you: ? Drink enough fluid to keep your urine clear or pale yellow. ? Take over-the-counter or prescription medicines. ? Eat foods that are high in fiber, such as fresh fruits and vegetables, whole grains, and beans. ? Limit foods that are high in fat and processed sugars, such as fried and sweet foods. Lifestyle  Avoid alcohol use.  Do not use any products that contain nicotine or tobacco, such as cigarettes and e-cigarettes. If you need help quitting, ask your health care provider.  Get at least 8 hours of sleep every night.  Limit your stress. General instructions   Keep a journal to find out what may trigger your migraine headaches. For example, write down: ? What you eat and drink. ? How much sleep you get. ? Any change to your diet or medicines.  If you have a migraine: ? Avoid things that make your symptoms worse, such as bright lights. ? It may help to lie down in a dark, quiet room. ? Do not drive or use heavy machinery. ? Ask your health care provider what activities are safe for you while you are experiencing symptoms.  Keep all follow-up visits as told by your health care provider. This is important. Contact a health care provider if:  You develop symptoms that are different or more severe than your usual migraine symptoms. Get help right away if:  Your migraine becomes severe.  You have a fever.  You have a stiff neck.  You have vision loss.  Your muscles feel weak or like you cannot control them.  You start to lose your balance often.  You develop trouble walking.  You faint. This information is not intended to replace advice given to you by your health care provider. Make sure you discuss any questions you have with your health care provider. Document Released: 05/01/2005 Document Revised: 11/19/2015 Document Reviewed: 10/18/2015 Elsevier Interactive Patient Education  2017 [employer].

## 2018-04-05 NOTE — Progress Notes (Signed)
Chief Complaint  Patient presents with  . more frequent migraines of 6-8 a month and before they were     wants fmla paperwork completed, blood mucus was 3-4 days but no longer has this issue  . Medication Refill    advair, albuterol    HPI Moderate Persistent Asthma Patient reports that her asthma is not as severe as previously due to the advair and albuterol  She needs refills She denies recent asthma attack When she takes all her medications the asthma seems less severe.   Migraines  Migraines have not improved She reports that she has been getting more frequent episodes and the imitrex does not help to prevent them They migraines behave very stereotypically and she can tell when they are coming She gets aura and headache on the right or left side and usually has to lay down in a dark room She denies vomiting but gets nausea She denies balance or speech changes No bed wetting No lethargy  Past Medical History:  Diagnosis Date  . Asthma   . Migraines     Current Outpatient Medications  Medication Sig Dispense Refill  . albuterol (PROAIR HFA) 108 (90 Base) MCG/ACT inhaler INHALE 2 PUFFS INTO THE LUNGS EVERY 6 HOURS AS NEEDED FOR WHEEZING OR SHORTNESS OF BREATH 8.5 g 3  . cetirizine (ZYRTEC) 10 MG tablet Take 1 tablet (10 mg total) by mouth daily. 30 tablet 11  . montelukast (SINGULAIR) 10 MG tablet TAKE 1 TABLET(10 MG) BY MOUTH AT BEDTIME 90 tablet 3  . SUMAtriptan (IMITREX) 25 MG tablet Take 1 tablet (25 mg total) by mouth every 2 (two) hours as needed for migraine. May repeat in 2 hours if headache persists or recurs. 30 tablet 3  . propranolol ER (INDERAL LA) 60 MG 24 hr capsule Take 1 capsule (60 mg total) by mouth daily. 30 capsule 0  . WIXELA INHUB 250-50 MCG/DOSE AEPB USE 1 INHALATION BY MOUTH  INTO THE LUNGS TWO TIMES  DAILY 60 each 3   No current facility-administered medications for this visit.     Allergies: No Known Allergies  No past surgical history on  file.  Social History   Socioeconomic History  . Marital status: Single    Spouse name: Not on file  . Number of children: Not on file  . Years of education: Not on file  . Highest education level: Not on file  Occupational History  . Not on file  Social Needs  . Financial resource strain: Not on file  . Food insecurity:    Worry: Not on file    Inability: Not on file  . Transportation needs:    Medical: Not on file    Non-medical: Not on file  Tobacco Use  . Smoking status: Never Smoker  . Smokeless tobacco: Never Used  Substance and Sexual Activity  . Alcohol use: Yes    Comment: SOCIAL  . Drug use: No  . Sexual activity: Yes  Lifestyle  . Physical activity:    Days per week: Not on file    Minutes per session: Not on file  . Stress: Not on file  Relationships  . Social connections:    Talks on phone: Not on file    Gets together: Not on file    Attends religious service: Not on file    Active member of club or organization: Not on file    Attends meetings of clubs or organizations: Not on file    Relationship status: Not  on file  Other Topics Concern  . Not on file  Social History Narrative  . Not on file    Family History  Problem Relation Age of Onset  . Heart disease Sister      ROS Review of Systems See HPI Constitution: No fevers or chills No malaise No diaphoresis Skin: No rash or itching Eyes: no blurry vision, no double vision GU: no dysuria or hematuria Neuro: no dizziness or headaches all others reviewed and negative   Objective: Vitals:   04/05/18 1308 04/05/18 1339  BP: (!) 139/95 126/82  Pulse: 96   Resp: 16   Temp: 97.7 F (36.5 C)   TempSrc: Oral   SpO2: 95%   Weight: 185 lb 12.8 oz (84.3 kg)   Height: 5' 6.34" (1.685 m)     Physical Exam  Constitutional: She is oriented to person, place, and time. She appears well-developed and well-nourished.  HENT:  Head: Normocephalic and atraumatic.  Right Ear: External ear  normal.  Left Ear: External ear normal.  Nose: Nose normal.  Mouth/Throat: Oropharynx is clear and moist.  Eyes: Pupils are equal, round, and reactive to light. Conjunctivae and EOM are normal.  Normal fundoscopic exam   Cardiovascular: Normal rate, regular rhythm and normal heart sounds. Exam reveals no friction rub.  No murmur heard. Pulmonary/Chest: Effort normal and breath sounds normal. No stridor. No respiratory distress.  Neurological: She is alert and oriented to person, place, and time.  Skin: Skin is warm. Capillary refill takes less than 2 seconds.  Psychiatric: She has a normal mood and affect. Her behavior is normal. Judgment and thought content normal.    Assessment and Plan Jessica Maldonado was seen today for more frequent migraines of 6-8 a month and before they were  and medication refill.  Diagnoses and all orders for this visit:  Migraine with aura and without status migrainosus, not intractable- discussed prophylactic medication with propanolol bp can tolerate this medication Plan to start 60mg   Continue imitrex and nsaids prn -     propranolol ER (INDERAL LA) 60 MG 24 hr capsule; Take 1 capsule (60 mg total) by mouth daily.  Moderate persistent asthma without complication - continue current meds     Jessica Maldonado

## 2018-04-09 ENCOUNTER — Other Ambulatory Visit: Payer: Self-pay | Admitting: Family Medicine

## 2018-04-15 ENCOUNTER — Encounter: Payer: Self-pay | Admitting: Family Medicine

## 2018-04-16 NOTE — Telephone Encounter (Signed)
The only forms that we have on file are from May 2019---if there have been new forms completed can we please make sure they are given to FMLA/Disability dept to scan into the patients chart.  Thank you

## 2018-04-16 NOTE — Telephone Encounter (Signed)
Located forms in Start binder at nurse station. I corrected the errors and re faxed the forms for the patient

## 2018-04-25 ENCOUNTER — Telehealth: Payer: Self-pay | Admitting: Family Medicine

## 2018-04-25 NOTE — Telephone Encounter (Signed)
MyChart message sent to pt about FMLA forms

## 2018-04-27 ENCOUNTER — Other Ambulatory Visit: Payer: Self-pay | Admitting: Family Medicine

## 2018-04-27 DIAGNOSIS — G43109 Migraine with aura, not intractable, without status migrainosus: Secondary | ICD-10-CM

## 2018-06-20 ENCOUNTER — Other Ambulatory Visit: Payer: Self-pay | Admitting: Family Medicine

## 2018-06-20 DIAGNOSIS — G43109 Migraine with aura, not intractable, without status migrainosus: Secondary | ICD-10-CM

## 2018-06-21 ENCOUNTER — Other Ambulatory Visit: Payer: Self-pay | Admitting: Family Medicine

## 2018-06-21 DIAGNOSIS — G43109 Migraine with aura, not intractable, without status migrainosus: Secondary | ICD-10-CM

## 2018-06-21 MED ORDER — PROPRANOLOL HCL ER 60 MG PO CP24: 60.0000 mg | ORAL_CAPSULE | Freq: Every day | ORAL | 0 refills | Status: DC

## 2018-06-21 NOTE — Telephone Encounter (Signed)
Please Advise

## 2018-06-22 NOTE — Telephone Encounter (Signed)
Please advise 

## 2018-06-24 NOTE — Telephone Encounter (Signed)
Left message medication has been sent to pharmacy

## 2018-07-02 MED ORDER — PROPRANOLOL HCL ER 60 MG PO CP24: 60.0000 mg | ORAL_CAPSULE | Freq: Every day | ORAL | 1 refills | Status: DC

## 2018-07-31 ENCOUNTER — Other Ambulatory Visit: Payer: Self-pay | Admitting: Family Medicine

## 2018-07-31 DIAGNOSIS — J4520 Mild intermittent asthma, uncomplicated: Secondary | ICD-10-CM

## 2018-08-06 ENCOUNTER — Other Ambulatory Visit: Payer: Self-pay | Admitting: Family Medicine

## 2018-08-08 ENCOUNTER — Encounter: Payer: Self-pay | Admitting: Family Medicine

## 2018-08-27 ENCOUNTER — Telehealth (INDEPENDENT_AMBULATORY_CARE_PROVIDER_SITE_OTHER): Payer: Self-pay | Admitting: Family Medicine

## 2018-08-27 ENCOUNTER — Other Ambulatory Visit: Payer: Self-pay

## 2018-08-27 ENCOUNTER — Encounter: Payer: Self-pay | Admitting: Family Medicine

## 2018-08-27 DIAGNOSIS — J4520 Mild intermittent asthma, uncomplicated: Secondary | ICD-10-CM

## 2018-08-27 DIAGNOSIS — J45901 Unspecified asthma with (acute) exacerbation: Secondary | ICD-10-CM

## 2018-08-27 MED ORDER — FLUTICASONE-SALMETEROL 500-50 MCG/DOSE IN AEPB: INHALATION_SPRAY | RESPIRATORY_TRACT | 1 refills | Status: DC

## 2018-08-27 MED ORDER — ALBUTEROL SULFATE HFA 108 (90 BASE) MCG/ACT IN AERS: INHALATION_SPRAY | RESPIRATORY_TRACT | 5 refills | Status: DC

## 2018-08-27 NOTE — Progress Notes (Signed)
Pt states she is having some SHOB, cough, and wheezing since last OV (04/05/2018). Pt states she has mucus when she cough. No chest pain at this time. No fever at this time but states she has had some within the time frame. Pt states she does have really bad allergies/asthma so she is concerned. She has been taking her allergy medication and it is not helping. Pt states fatigue has been  An issue also.   No travel outside the country.  Pt states if she does not answer on her current cell listed to call her at [phone]

## 2018-08-27 NOTE — Progress Notes (Signed)
Telemedicine Encounter- SOAP NOTE Established Patient  I discussed the limitations, risks, security and privacy concerns of performing an evaluation and management service by telephone and the availability of in person appointments. I also discussed with the patient that there may be a patient responsible charge related to this service. The patient expressed understanding and agreed to proceed.  This telephone encounter was conducted with the patient's verbal consent via audio telecommunications: yes Patient was instructed to have this encounter in a suitably private space; and to only have persons present to whom they give permission to participate. In addition, patient identity was confirmed by use of name plus two identifiers (DOB and address).  I spent a total of 35 talking with the patient or their proxy.  Pt states she is having some SHOB, cough, and wheezing since last OV (04/05/2018). Pt states she has mucus when she cough. No chest pain at this time. No fever at this time but states she has had some within the time frame. Pt states she does have really bad allergies/asthma so she is concerned. She has been taking her allergy medication and it is not helping. Pt states fatigue has been  An issue also.   No travel outside the country.  Pt states if she does not answer on her current cell listed to call her at [phone](pt was reached at this number)  Subjective   Jessica Maldonado is a 38 y.o. female established patient. Telephone visit today for cough/SOB-h/o asthma I've been sick for a month-given zpack 1 month ago, still using asthma meds and otc cough/cold. Seen in UC one month ago for-cough, fever-highest 102, dry and wet cough and SOB. zpack and cold medicationsuggested Pt states after zpack completed continued taking cold medication due to continued cough.  No improvement in cough and SOB associated with ADL.Marland Kitchen Pt especially winded at night time.   Pt states she must sit  to catch breath and relax at times. 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 pts eyes are red at night. When pt goes to sleep redness goes completely away.  No prior h/o eye irritation with allergies   Diagnosed with asthma 1 year ago-first started on albuterol, 6 months ago wixela added. Pt stable prior to 1 month ago. It was recommended that pt follow up with allergy and asthma specialist but she has not scheduled yet.  Allergies-pt takes zyrtec and singulair  Migraines improved with propranolol-using less imitrex-down from headaches 8x/month to 2x/month  Patient Active Problem List   Diagnosis Date Noted  . Moderate persistent asthma without complication 10/93/2355  . Migraine with aura and without status migrainosus, not intractable 09/27/2017  . Anemia 12/22/2011  . Fibroid uterus 12/22/2011  . DUB (dysfunctional uterine bleeding) 12/05/2011    Past Medical History:  Diagnosis Date  . Asthma   . Migraines     Current Outpatient Medications  Medication Sig Dispense Refill  . albuterol (PROAIR HFA) 108 (90 Base) MCG/ACT inhaler USE 2 PUFFS BY MOUTH INTO  THE LUNGS EVERY 6 HOURS AS  NEEDED FOR WHEEZING OR  SHORTNESS OF BREATH 34 g 0  . cetirizine (ZYRTEC) 10 MG tablet TAKE 1 TABLET BY MOUTH  DAILY 90 tablet 0  . montelukast (SINGULAIR) 10 MG tablet TAKE 1 TABLET(10 MG) BY MOUTH AT BEDTIME 90 tablet 3  . propranolol ER (INDERAL LA) 60 MG 24 hr capsule Take 1 capsule (60 mg total) by mouth daily. [place]  capsule 1  . SUMAtriptan (IMITREX) 25 MG tablet TAKE 1 TABLET BY MOUTH  EVERY 2 HOURS AS NEEDED FOR MIGRAINE. MAY REPEAT IN 2  HOURS IF HEADACHE PERSISTS  OR RECURS. 27 tablet 0  . WIXELA INHUB 250-50 MCG/DOSE AEPB USE 1 INHALATION BY MOUTH  INTO THE LUNGS TWO TIMES  DAILY 60 each 2   No current facility-administered medications for this visit.     No Known Allergies  Social History   Socioeconomic History  . Marital  status: Single    Spouse name: Not on file  . Number of children: Not on file  . Years of education: Not on file  . Highest education level: Not on file  Occupational History  . Not on file  Social Needs  . Financial resource strain: Not on file  . Food insecurity:    Worry: Not on file    Inability: Not on file  . Transportation needs:    Medical: Not on file    Non-medical: Not on file  Tobacco Use  . Smoking status: Never Smoker  . Smokeless tobacco: Never Used  Substance and Sexual Activity  . Alcohol use: Yes    Comment: SOCIAL  . Drug use: No  . Sexual activity: Yes  Lifestyle  . Physical activity:    Days per week: Not on file    Minutes per session: Not on file  . Stress: Not on file  Relationships  . Social connections:    Talks on phone: Not on file    Gets together: Not on file    Attends religious service: Not on file    Active member of club or organization: Not on file    Attends meetings of clubs or organizations: Not on file    Relationship status: Not on file  . Intimate partner violence:    Fear of current or ex partner: Not on file    Emotionally abused: Not on file    Physically abused: Not on file    Forced sexual activity: Not on file  Other Topics Concern  . Not on file  Social History Narrative  . Not on file    ROS CONSTITUTIONAL: , fever-last fever 1 week ago-noted for 3 weeks low grade EENT: sinus problems, nasal congestion- sore throat in the morning CV: no chest pain, RESP: SOB with  Cough and wheezing, sputum productionGI: no heartburn, constipation, diarrhea, food intolerance, abdominal pain, difficulty swallowing, nausea, vomiting, blood in stool, bowel changes ALLERGY:seasonal allergies   Objective  no vital signs discussed with pt by telephone. Pt did not become breathless with talking on the telephone for 49min. No cough heard while on call   I discussed the assessment and treatment plan with the patient. The patient was  provided an opportunity to ask questions and all were answered. The patient agreed with the plan and demonstrated an understanding of the instructions.   The patient was advised to call back or seek an in-person evaluation if the symptoms worsen or if the condition fails to improve as anticipated. Precautions for exposure discussed with pt   I provided 35 minutes of non-face-to-face time during this encounter.  Rawn Quiroa Hannah Beat, MD  Primary Care at [facility] 08-27-18

## 2018-08-27 NOTE — Patient Instructions (Signed)
Use albuterol prior to advair(increase dose to 500) for the next month then trial to wean back to 250. Use albuterol if needed every 4-6 hours  Avoid going outside to reduce exposure

## 2018-08-28 ENCOUNTER — Other Ambulatory Visit: Payer: Self-pay

## 2018-08-28 DIAGNOSIS — G43109 Migraine with aura, not intractable, without status migrainosus: Secondary | ICD-10-CM

## 2018-08-28 MED ORDER — PROPRANOLOL HCL ER 60 MG PO CP24: 60.0000 mg | ORAL_CAPSULE | Freq: Every day | ORAL | 1 refills | Status: DC

## 2018-10-18 ENCOUNTER — Other Ambulatory Visit: Payer: Self-pay | Admitting: Family Medicine

## 2018-10-19 NOTE — Telephone Encounter (Signed)
Requested Prescriptions  Pending Prescriptions Disp Refills  . montelukast (SINGULAIR) 10 MG tablet [Pharmacy Med Name: MONTELUKAST 10MG  TABLET] 90 tablet 3    Sig: TAKE 1 TABLET BY MOUTH AT  BEDTIME     Pulmonology:  Leukotriene Inhibitors Passed - 10/18/2018 10:08 PM      Passed - Valid encounter within last 12 months    Recent Outpatient Visits          6 months ago Migraine with aura and without status migrainosus, not intractable   Primary Care at [facility], Zoe A, MD   1 year ago Moderate persistent asthma without complication   [facility] at [facility] [place] A, MD   1 year ago Reactive airway disease with wheezing, mild intermittent, uncomplicated   Primary Care at [facility], Zoe A, MD   1 year ago Reactive airway disease with wheezing, mild intermittent, uncomplicated   Primary Care at [facility], Arlie Solomons, MD   1 year ago Wheezing   Primary Care at [facility], Gelene Mink, [place]      Future Appointments            In 1 week Forrest Moron, MD Primary Care at [place], [facility]

## 2018-10-29 ENCOUNTER — Telehealth: Payer: Self-pay | Admitting: Family Medicine

## 2018-10-29 ENCOUNTER — Ambulatory Visit: Payer: Self-pay | Admitting: Family Medicine

## 2018-10-29 NOTE — Telephone Encounter (Signed)
Called pt to reschedule, [email or web address]

## 2018-11-05 ENCOUNTER — Other Ambulatory Visit: Payer: Self-pay

## 2018-11-05 ENCOUNTER — Encounter: Payer: Self-pay | Admitting: Family Medicine

## 2018-11-05 ENCOUNTER — Ambulatory Visit: Payer: Managed Care, Other (non HMO) | Admitting: Family Medicine

## 2018-11-05 DIAGNOSIS — J4520 Mild intermittent asthma, uncomplicated: Secondary | ICD-10-CM | POA: Diagnosis not present

## 2018-11-05 DIAGNOSIS — G43109 Migraine with aura, not intractable, without status migrainosus: Secondary | ICD-10-CM

## 2018-11-05 MED ORDER — PROPRANOLOL HCL ER 60 MG PO CP24: 60.0000 mg | ORAL_CAPSULE | Freq: Every day | ORAL | 1 refills | Status: DC

## 2018-11-05 MED ORDER — MONTELUKAST SODIUM 10 MG PO TABS: ORAL_TABLET | ORAL | 3 refills | Status: DC

## 2018-11-05 MED ORDER — CETIRIZINE HCL 10 MG PO TABS: 10.0000 mg | ORAL_TABLET | Freq: Every day | ORAL | 3 refills | Status: AC

## 2018-11-05 MED ORDER — ALBUTEROL SULFATE HFA 108 (90 BASE) MCG/ACT IN AERS: INHALATION_SPRAY | RESPIRATORY_TRACT | 5 refills | Status: AC

## 2018-11-05 MED ORDER — FLUTICASONE-SALMETEROL 500-50 MCG/DOSE IN AEPB: INHALATION_SPRAY | RESPIRATORY_TRACT | 6 refills | Status: AC

## 2018-11-05 NOTE — Patient Instructions (Signed)
° ° ° °  If you have lab work done today you will be contacted with your lab results within the next 2 weeks.  If you have not heard from us then please contact us. The fastest way to get your results is to register for My Chart. ° ° °IF you received an x-ray today, you will receive an invoice from [facility]. Please contact [facility] at [phone] with questions or concerns regarding your invoice.  ° °IF you received labwork today, you will receive an invoice from [facility]. Please contact [facility] at [phone] with questions or concerns regarding your invoice.  ° °Our billing staff will not be able to assist you with questions regarding bills from these companies. ° °You will be contacted with the lab results as soon as they are available. The fastest way to get your results is to activate your My Chart account. Instructions are located on the last page of this paperwork. If you have not heard from us regarding the results in 2 weeks, please contact this office. °  ° ° ° °

## 2018-11-05 NOTE — Progress Notes (Signed)
Established Patient Office Visit  Subjective:  Patient ID: Jessica Maldonado, female    DOB: 04/26/1981  Age: 38 y.o. MRN: [id number]  CC:  Chief Complaint  Patient presents with  . Medication Refill    inderal LA, advair, albuterol inhaler and fmla paperwork    HPI Jessica Maldonado presents for   Patient reports that her headaches are better She still gets 2-3 headaches a month She is here to get her FMLA form completed She states that her headaches last 2-3 days. The intensity is less.  Patient reports that she has been having some increase asthma symptoms with the weather fluctuations She reports that her migraines also affect her asthma She is beginning to use more of her albuterol.  She needs refills of her meds.  Her asthma is still considered well controlled.      Past Medical History:  Diagnosis Date  . Asthma   . Migraines     No past surgical history on file.  Family History  Problem Relation Age of Onset  . Heart disease Sister     Social History   Socioeconomic History  . Marital status: Single    Spouse name: Not on file  . Number of children: Not on file  . Years of education: Not on file  . Highest education level: Not on file  Occupational History  . Not on file  Social Needs  . Financial resource strain: Not on file  . Food insecurity    Worry: Not on file    Inability: Not on file  . Transportation needs    Medical: Not on file    Non-medical: Not on file  Tobacco Use  . Smoking status: Never Smoker  . Smokeless tobacco: Never Used  Substance and Sexual Activity  . Alcohol use: Yes    Comment: SOCIAL  . Drug use: No  . Sexual activity: Yes  Lifestyle  . Physical activity    Days per week: Not on file    Minutes per session: Not on file  . Stress: Not on file  Relationships  . Social Herbalist on phone: Not on file    Gets together: Not on file    Attends religious service: Not on file    Active member of  club or organization: Not on file    Attends meetings of clubs or organizations: Not on file    Relationship status: Not on file  . Intimate partner violence    Fear of current or ex partner: Not on file    Emotionally abused: Not on file    Physically abused: Not on file    Forced sexual activity: Not on file  Other Topics Concern  . Not on file  Social History Narrative  . Not on file    Outpatient Medications Prior to Visit  Medication Sig Dispense Refill  . SUMAtriptan (IMITREX) 25 MG tablet TAKE 1 TABLET BY MOUTH  EVERY 2 HOURS AS NEEDED FOR MIGRAINE. MAY REPEAT IN 2  HOURS IF HEADACHE PERSISTS  OR RECURS. 27 tablet 0  . albuterol (PROAIR HFA) 108 (90 Base) MCG/ACT inhaler USE 2 PUFFS BY MOUTH INTO  THE LUNGS EVERY 6 HOURS AS  NEEDED FOR WHEEZING OR  SHORTNESS OF BREATH 34 g 5  . cetirizine (ZYRTEC) 10 MG tablet TAKE 1 TABLET BY MOUTH  DAILY 90 tablet 0  . Fluticasone-Salmeterol (WIXELA INHUB) 500-50 MCG/DOSE AEPB 1 inhalation twice a day, use albuterol prior to inhalation 60  each 1  . montelukast (SINGULAIR) 10 MG tablet TAKE 1 TABLET BY MOUTH AT  BEDTIME 90 tablet 3  . propranolol ER (INDERAL LA) 60 MG 24 hr capsule Take 1 capsule (60 mg total) by mouth daily. 90 capsule 1   No facility-administered medications prior to visit.     No Known Allergies  ROS Review of Systems Review of Systems  Constitutional: Negative for activity change, appetite change, chills and fever.  HENT: Negative for congestion, nosebleeds, trouble swallowing and voice change.   Respiratory: Negative for cough, shortness of breath and wheezing.   Gastrointestinal: Negative for diarrhea, nausea and vomiting.  Genitourinary: Negative for difficulty urinating, dysuria, flank pain and hematuria.  Musculoskeletal: Negative for back pain, joint swelling and neck pain.  Neurological: Negative for dizziness, speech difficulty, light-headedness and numbness.  See HPI. All other review of systems negative.      Objective:    Physical Exam  BP 124/81 (BP Location: Right Arm, Patient Position: Sitting, Cuff Size: Normal)   Pulse 79   Temp 98.8 F (37.1 C) (Oral)   Resp 16   Ht 5' 6.34" (1.685 m)   Wt 189 lb 6.4 oz (85.9 kg)   LMP 11/05/2018   SpO2 96%   BMI 30.26 kg/m  Wt Readings from Last 3 Encounters:  11/05/18 189 lb 6.4 oz (85.9 kg)  04/05/18 185 lb 12.8 oz (84.3 kg)  09/27/17 183 lb (83 kg)   Physical Exam  Constitutional: Oriented to person, place, and time. Appears well-developed and well-nourished.  HENT:  Head: Normocephalic and atraumatic.  Eyes: Conjunctivae and EOM are normal.  Cardiovascular: Normal rate, regular rhythm, normal heart sounds and intact distal pulses.  No murmur heard. Pulmonary/Chest: Effort normal and breath sounds normal. No stridor. No respiratory distress. Has no wheezes.  Neurological: Is alert and oriented to person, place, and time.  Skin: Skin is warm. Capillary refill takes less than 2 seconds.  Psychiatric: Has a normal mood and affect. Behavior is normal. Judgment and thought content normal.    Health Maintenance Due  Topic Date Due  . HIV Screening  12/08/1997  . TETANUS/TDAP  12/08/2001  . PAP SMEAR-Modifier  12/09/2003  . INFLUENZA VACCINE  12/14/2018    There are no preventive care reminders to display for this patient.  Lab Results  Component Value Date   TSH 2.920 12/05/2011   Lab Results  Component Value Date   WBC 7.7 06/07/2012   HGB 12.4 06/07/2012   HCT 37.6 06/07/2012   MCV 84.3 06/07/2012   PLT 288 06/07/2012   Lab Results  Component Value Date   NA 139 06/07/2012   K 4.0 06/07/2012   CO2 24 06/07/2012   GLUCOSE 75 06/07/2012   BUN 10 06/07/2012   CREATININE 0.90 06/07/2012   BILITOT 0.7 06/07/2012   ALKPHOS 43 06/07/2012   AST 12 06/07/2012   ALT 8 06/07/2012   PROT 7.5 06/07/2012   ALBUMIN 4.0 06/07/2012   CALCIUM 9.4 06/07/2012   No results found for: CHOL No results found for: HDL No results  found for: LDLCALC No results found for: TRIG No results found for: CHOLHDL No results found for: HGBA1C    Assessment & Plan:   Problem List Items Addressed This Visit      Cardiovascular and Mediastinum   Migraine with aura and without status migrainosus, not intractable Stable, refilled meds, completed FMLA   Relevant Medications   propranolol ER (INDERAL LA) 60 MG 24 hr capsule  Other Visit Diagnoses    Reactive airway disease with wheezing, mild intermittent, uncomplicated     -    Continue to take current meds and also take antihistamines and singulair   Relevant Medications   montelukast (SINGULAIR) 10 MG tablet   albuterol (PROAIR HFA) 108 (90 Base) MCG/ACT inhaler   cetirizine (ZYRTEC) 10 MG tablet   Fluticasone-Salmeterol (WIXELA INHUB) 500-50 MCG/DOSE AEPB      Meds ordered this encounter  Medications  . montelukast (SINGULAIR) 10 MG tablet    Sig: TAKE 1 TABLET BY MOUTH AT  BEDTIME    Dispense:  90 tablet    Refill:  3    Store on file  . albuterol (PROAIR HFA) 108 (90 Base) MCG/ACT inhaler    Sig: USE 2 PUFFS BY MOUTH INTO  THE LUNGS EVERY 6 HOURS AS  NEEDED FOR WHEEZING OR  SHORTNESS OF BREATH    Dispense:  34 g    Refill:  5  . propranolol ER (INDERAL LA) 60 MG 24 hr capsule    Sig: Take 1 capsule (60 mg total) by mouth daily.    Dispense:  90 capsule    Refill:  1  . cetirizine (ZYRTEC) 10 MG tablet    Sig: Take 1 tablet (10 mg total) by mouth daily.    Dispense:  90 tablet    Refill:  3  . Fluticasone-Salmeterol (WIXELA INHUB) 500-50 MCG/DOSE AEPB    Sig: 1 inhalation twice a day, use albuterol prior to inhalation    Dispense:  60 each    Refill:  6    Follow-up: No follow-ups on file.    Forrest Moron, MD

## 2018-11-28 ENCOUNTER — Telehealth: Payer: Self-pay | Admitting: Family Medicine

## 2018-11-28 NOTE — Telephone Encounter (Signed)
Copied from [place] [id number]. Topic: General - Inquiry >> Nov 28, 2018 12:26 PM Richardo Priest, NT wrote: Reason for CRM: Patient stating FMLA paperwork is incorrect on the dates. That it should say it started on May 25th. Please advise.

## 2018-11-28 NOTE — Telephone Encounter (Signed)
Copied from [place] [id number]. Topic: General - Inquiry >> Nov 28, 2018 12:26 PM Richardo Priest, NT wrote: Reason for CRM: Patient stating FMLA paperwork is incorrect on the dates. That it should say it started on May 25th. Please advise.

## 2018-12-01 NOTE — Telephone Encounter (Signed)
Left message on voicemail to return my call (delores) regarding fmla paperwork incorrect dates. [place]

## 2019-02-20 IMAGING — DX DG CHEST 2V
2 series · 2 of 2 positions shown · non-contrast
Comparison: Radiographs January 01, 2013.

CLINICAL DATA: Cough.

EXAM:
CHEST  2 VIEW

[chest pa]
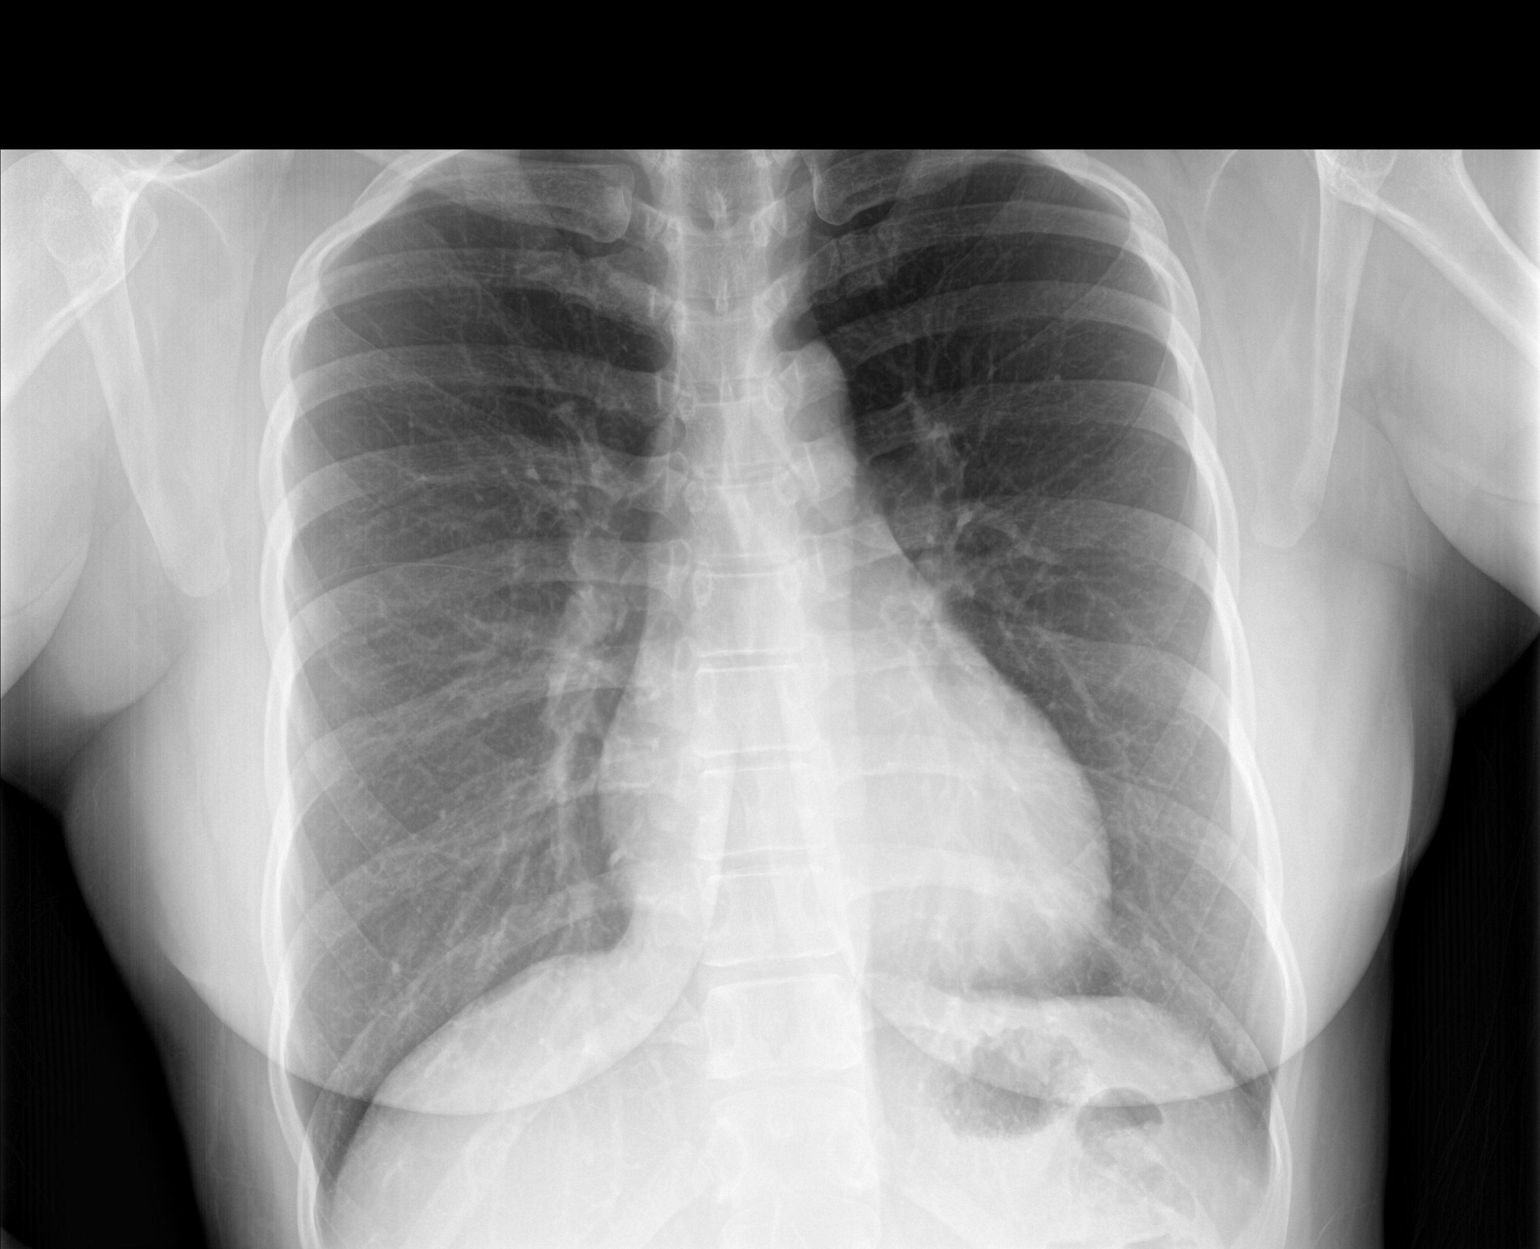

[chest lat]
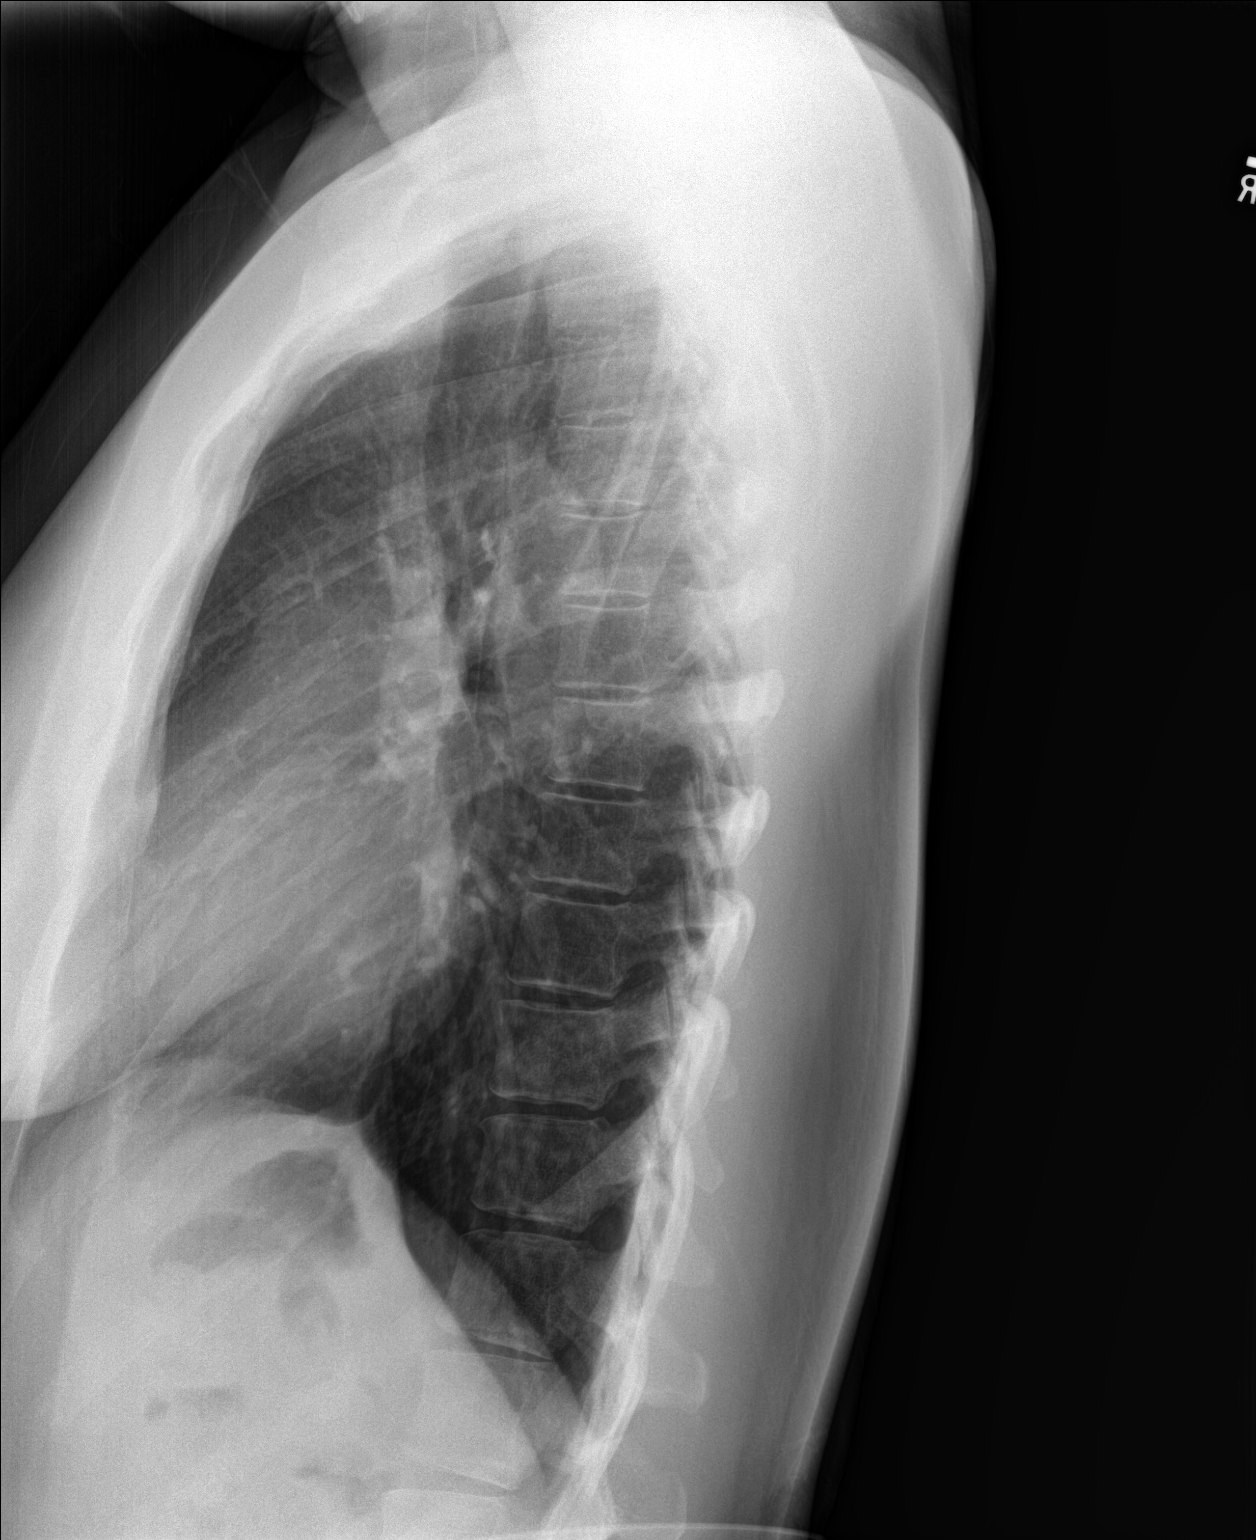

[2 of 2 positions shown; findings below may reference images not displayed]

FINDINGS: The heart size and mediastinal contours are within normal limits.
Both lungs are clear. No pneumothorax or pleural effusion is noted.
The visualized skeletal structures are unremarkable.
IMPRESSION: No active cardiopulmonary disease.

## 2019-04-01 ENCOUNTER — Encounter: Payer: Self-pay | Admitting: Family Medicine

## 2019-04-14 ENCOUNTER — Ambulatory Visit: Payer: Managed Care, Other (non HMO) | Admitting: Family Medicine

## 2019-04-14 ENCOUNTER — Other Ambulatory Visit: Payer: Self-pay

## 2019-04-14 VITALS — BP 125/86 | HR 71 | Temp 98.6°F | Ht 65.0 in | Wt 199.0 lb

## 2019-04-14 DIAGNOSIS — J45901 Unspecified asthma with (acute) exacerbation: Secondary | ICD-10-CM

## 2019-04-14 DIAGNOSIS — Z23 Encounter for immunization: Secondary | ICD-10-CM

## 2019-04-14 DIAGNOSIS — G43109 Migraine with aura, not intractable, without status migrainosus: Secondary | ICD-10-CM | POA: Diagnosis not present

## 2019-04-14 NOTE — Patient Instructions (Signed)
° ° ° °  If you have lab work done today you will be contacted with your lab results within the next 2 weeks.  If you have not heard from us then please contact us. The fastest way to get your results is to register for My Chart. ° ° °IF you received an x-ray today, you will receive an invoice from [facility]. Please contact [facility] at [phone] with questions or concerns regarding your invoice.  ° °IF you received labwork today, you will receive an invoice from [facility]. Please contact [facility] at [phone] with questions or concerns regarding your invoice.  ° °Our billing staff will not be able to assist you with questions regarding bills from these companies. ° °You will be contacted with the lab results as soon as they are available. The fastest way to get your results is to activate your My Chart account. Instructions are located on the last page of this paperwork. If you have not heard from us regarding the results in 2 weeks, please contact this office. °  ° ° ° °

## 2019-04-29 ENCOUNTER — Encounter: Payer: Self-pay | Admitting: Family Medicine

## 2019-05-07 ENCOUNTER — Encounter: Payer: Self-pay | Admitting: Family Medicine

## 2019-05-14 NOTE — Progress Notes (Signed)
Established Patient Office Visit  Subjective:  Patient ID: Jessica Maldonado, female    DOB: [birth year]  Age: 38 y.o. MRN: [id number]  CC:  Chief Complaint  Patient presents with  . FMLA    paperwork completion    HPI Jessica Maldonado presents for   Pt here for fmla for her migraines She states that things are  Unchanged.  She gets 2-3 episodes a week   She also reports that she is just getting over an asthma attack She feels better now but was coughing and wheezing prior to this week   Past Medical History:  Diagnosis Date  . Asthma   . Migraines     No past surgical history on file.  Family History  Problem Relation Age of Onset  . Heart disease Sister     Social History   Socioeconomic History  . Marital status: Single    Spouse name: Not on file  . Number of children: Not on file  . Years of education: Not on file  . Highest education level: Not on file  Occupational History  . Not on file  Tobacco Use  . Smoking status: Never Smoker  . Smokeless tobacco: Never Used  Substance and Sexual Activity  . Alcohol use: Yes    Comment: SOCIAL  . Drug use: No  . Sexual activity: Yes  Other Topics Concern  . Not on file  Social History Narrative  . Not on file   Social Determinants of Health   Financial Resource Strain:   . Difficulty of Paying Living Expenses: Not on file  Food Insecurity:   . Worried About Charity fundraiser in the Last Year: Not on file  . Ran Out of Food in the Last Year: Not on file  Transportation Needs:   . Lack of Transportation (Medical): Not on file  . Lack of Transportation (Non-Medical): Not on file  Physical Activity:   . Days of Exercise per Week: Not on file  . Minutes of Exercise per Session: Not on file  Stress:   . Feeling of Stress : Not on file  Social Connections:   . Frequency of Communication with Friends and Family: Not on file  . Frequency of Social Gatherings with Friends and Family: Not on file    . Attends Religious Services: Not on file  . Active Member of Clubs or Organizations: Not on file  . Attends Archivist Meetings: Not on file  . Marital Status: Not on file  Intimate Partner Violence:   . Fear of Current or Ex-Partner: Not on file  . Emotionally Abused: Not on file  . Physically Abused: Not on file  . Sexually Abused: Not on file    Outpatient Medications Prior to Visit  Medication Sig Dispense Refill  . albuterol (PROAIR HFA) 108 (90 Base) MCG/ACT inhaler USE 2 PUFFS BY MOUTH INTO  THE LUNGS EVERY 6 HOURS AS  NEEDED FOR WHEEZING OR  SHORTNESS OF BREATH 34 g 5  . cetirizine (ZYRTEC) 10 MG tablet Take 1 tablet (10 mg total) by mouth daily. 90 tablet 3  . Fluticasone-Salmeterol (WIXELA INHUB) 500-50 MCG/DOSE AEPB 1 inhalation twice a day, use albuterol prior to inhalation 60 each 6  . montelukast (SINGULAIR) 10 MG tablet TAKE 1 TABLET BY MOUTH AT  BEDTIME 90 tablet 3  . propranolol ER (INDERAL LA) 60 MG 24 hr capsule Take 1 capsule (60 mg total) by mouth daily. 90 capsule 1  . SUMAtriptan (IMITREX) 25  MG tablet TAKE 1 TABLET BY MOUTH  EVERY 2 HOURS AS NEEDED FOR MIGRAINE. MAY REPEAT IN 2  HOURS IF HEADACHE PERSISTS  OR RECURS. 27 tablet 0   No facility-administered medications prior to visit.    No Known Allergies  ROS Review of Systems Review of Systems  Constitutional: Negative for activity change, appetite change, chills and fever.  HENT: Negative for congestion, nosebleeds, trouble swallowing and voice change.   Respiratory: see hpi Gastrointestinal: Negative for diarrhea, nausea and vomiting.  Genitourinary: Negative for difficulty urinating, dysuria, flank pain and hematuria.  Musculoskeletal: Negative for back pain, joint swelling and neck pain.  Neurological: Negative for dizziness, speech difficulty, light-headedness and numbness.  See HPI. All other review of systems negative.     Objective:    Physical Exam  BP 125/86 (BP Location: Right  Arm, Patient Position: Sitting, Cuff Size: Normal)   Pulse 71   Temp 98.6 F (37 C) (Oral)   Ht 5\' 5"  (1.651 m)   Wt 199 lb (90.3 kg)   SpO2 97%   BMI 33.12 kg/m  Wt Readings from Last 3 Encounters:  04/14/19 199 lb (90.3 kg)  11/05/18 189 lb 6.4 oz (85.9 kg)  04/05/18 185 lb 12.8 oz (84.3 kg)   Physical Exam  Constitutional: Oriented to person, place, and time. Appears well-developed and well-nourished.  HENT:  Head: Normocephalic and atraumatic.  Eyes: Conjunctivae and EOM are normal.  Cardiovascular: Normal rate, regular rhythm, normal heart sounds and intact distal pulses.  No murmur heard. Pulmonary/Chest: Effort normal and breath sounds normal. No stridor. No respiratory distress. Has no wheezes.  Neurological: Is alert and oriented to person, place, and time.  Skin: Skin is warm. Capillary refill takes less than 2 seconds.  Psychiatric: Has a normal mood and affect. Behavior is normal. Judgment and thought content normal.    Health Maintenance Due  Topic Date Due  . HIV Screening  12/08/1997  . TETANUS/TDAP  12/08/2001  . PAP SMEAR-Modifier  12/09/2003  . INFLUENZA VACCINE  12/14/2018    There are no preventive care reminders to display for this patient.  Lab Results  Component Value Date   TSH 2.920 12/05/2011   Lab Results  Component Value Date   WBC 7.7 06/07/2012   HGB 12.4 06/07/2012   HCT 37.6 06/07/2012   MCV 84.3 06/07/2012   PLT 288 06/07/2012   Lab Results  Component Value Date   NA 139 06/07/2012   K 4.0 06/07/2012   CO2 24 06/07/2012   GLUCOSE 75 06/07/2012   BUN 10 06/07/2012   CREATININE 0.90 06/07/2012   BILITOT 0.7 06/07/2012   ALKPHOS 43 06/07/2012   AST 12 06/07/2012   ALT 8 06/07/2012   PROT 7.5 06/07/2012   ALBUMIN 4.0 06/07/2012   CALCIUM 9.4 06/07/2012   No results found for: CHOL No results found for: HDL No results found for: LDLCALC No results found for: TRIG No results found for: CHOLHDL No results found for:  HGBA1C    Assessment & Plan:   Problem List Items Addressed This Visit      Cardiovascular and Mediastinum   Migraine with aura and without status migrainosus, not intractable  completed FMLA for patient to allow time for migraines  Compared today's form to previous     Respiratory   Persistent asthma with acute exacerbation  - improving compared to previous    Other Visit Diagnoses    Need for prophylactic vaccination and inoculation against influenza    -  Primary      No orders of the defined types were placed in this encounter.   Follow-up: No follow-ups on file.    Forrest Moron, MD

## 2019-05-23 ENCOUNTER — Other Ambulatory Visit: Payer: Self-pay | Admitting: Family Medicine

## 2019-05-23 DIAGNOSIS — G43109 Migraine with aura, not intractable, without status migrainosus: Secondary | ICD-10-CM

## 2019-06-12 NOTE — Telephone Encounter (Signed)
FMLA form completed on 05/14/2019 by [employer].

## 2019-06-12 NOTE — Telephone Encounter (Signed)
FMLA form completed by [employer] on 05/14/2019.

## 2019-08-21 ENCOUNTER — Other Ambulatory Visit: Payer: Self-pay

## 2019-09-12 ENCOUNTER — Other Ambulatory Visit: Payer: Self-pay | Admitting: Family Medicine

## 2019-09-12 DIAGNOSIS — J4520 Mild intermittent asthma, uncomplicated: Secondary | ICD-10-CM

## 2019-09-12 NOTE — Telephone Encounter (Signed)
Requested  medications are  due for refill today yes  Requested medications are on the active medication list yes  Last refill 3/10  Future visit scheduled yes  Notes to clinic This pt does not have a PCP listed.

## 2019-09-22 ENCOUNTER — Other Ambulatory Visit: Payer: Self-pay

## 2019-09-22 ENCOUNTER — Ambulatory Visit: Payer: Managed Care, Other (non HMO) | Admitting: Family Medicine

## 2019-09-22 ENCOUNTER — Encounter: Payer: Self-pay | Admitting: Family Medicine

## 2019-09-22 NOTE — Patient Instructions (Signed)
° ° ° °  If you have lab work done today you will be contacted with your lab results within the next 2 weeks.  If you have not heard from us then please contact us. The fastest way to get your results is to register for My Chart. ° ° °IF you received an x-ray today, you will receive an invoice from [facility]. Please contact [facility] at [phone] with questions or concerns regarding your invoice.  ° °IF you received labwork today, you will receive an invoice from [facility]. Please contact [facility] at [phone] with questions or concerns regarding your invoice.  ° °Our billing staff will not be able to assist you with questions regarding bills from these companies. ° °You will be contacted with the lab results as soon as they are available. The fastest way to get your results is to activate your My Chart account. Instructions are located on the last page of this paperwork. If you have not heard from us regarding the results in 2 weeks, please contact this office. °  ° ° ° °

## 2019-09-24 ENCOUNTER — Encounter: Payer: Self-pay | Admitting: Family Medicine

## 2019-09-25 NOTE — Telephone Encounter (Signed)
F/u with dr Nolon Rod re: inadequate info from office visit to approve.

## 2019-10-17 ENCOUNTER — Telehealth: Payer: Self-pay | Admitting: Family Medicine

## 2019-10-17 NOTE — Telephone Encounter (Signed)
I have called the pt and gone over the form with her. We have completed the question 5 and re-faxed FMLA form back to [facility] and pt stated understanding.

## 2019-10-17 NOTE — Telephone Encounter (Signed)
Patient is calling requesting FMLA paperwork be  resent part A medical facts question number 5 was not complete. Paperwork needs to be resent please reach out to patient when completed . Ok to leave voicemail   Please see mychart message  Actual copy in there.

## 2019-11-07 ENCOUNTER — Telehealth: Payer: Self-pay

## 2019-11-07 NOTE — Telephone Encounter (Signed)
Fax came threw of paperwork of pt. Paperwork is left in providers box at nurses station.

## 2019-11-07 NOTE — Telephone Encounter (Signed)
Patient work is in Environmental manager

## 2019-11-10 ENCOUNTER — Telehealth: Payer: Self-pay | Admitting: Emergency Medicine

## 2019-11-10 NOTE — Telephone Encounter (Signed)
Received a fax from [employer] about forms needed to be filled out. Spoke to [employer] with the [facility] . Forms has been faxed to [facility] at [phone] to DR Nolon Rod to fill out.

## 2019-11-10 NOTE — Telephone Encounter (Signed)
These are to be completed by Dr Nolon Rod whom she will continue to see in her new practice
